# Patient Record
Sex: Male | Born: 1935 | Race: White | Hispanic: No | Marital: Married | Smoking: Never smoker
Health system: Southern US, Community
[De-identification: ages and names within clinical notes are randomized; demographics above are authoritative.]

## PROBLEM LIST (undated history)

## (undated) DIAGNOSIS — M5124 Other intervertebral disc displacement, thoracic region: Secondary | ICD-10-CM

## (undated) DIAGNOSIS — K219 Gastro-esophageal reflux disease without esophagitis: Secondary | ICD-10-CM

## (undated) DIAGNOSIS — L570 Actinic keratosis: Secondary | ICD-10-CM

## (undated) DIAGNOSIS — D649 Anemia, unspecified: Secondary | ICD-10-CM

## (undated) DIAGNOSIS — E7800 Pure hypercholesterolemia, unspecified: Secondary | ICD-10-CM

## (undated) DIAGNOSIS — E079 Disorder of thyroid, unspecified: Secondary | ICD-10-CM

## (undated) DIAGNOSIS — I2510 Atherosclerotic heart disease of native coronary artery without angina pectoris: Secondary | ICD-10-CM

## (undated) DIAGNOSIS — I10 Essential (primary) hypertension: Secondary | ICD-10-CM

## (undated) DIAGNOSIS — E039 Hypothyroidism, unspecified: Secondary | ICD-10-CM

## (undated) DIAGNOSIS — E559 Vitamin D deficiency, unspecified: Secondary | ICD-10-CM

## (undated) DIAGNOSIS — N400 Enlarged prostate without lower urinary tract symptoms: Secondary | ICD-10-CM

## (undated) DIAGNOSIS — K589 Irritable bowel syndrome without diarrhea: Secondary | ICD-10-CM

## (undated) HISTORY — PX: CARDIAC CATHETERIZATION: SHX172

## (undated) HISTORY — PX: OTHER SURGICAL HISTORY: SHX169

## (undated) HISTORY — DX: Actinic keratosis: L57.0

## (undated) HISTORY — PX: COLONOSCOPY: SHX174

---

## 1994-02-26 HISTORY — PX: HERNIA REPAIR: SHX51

## 2003-02-21 ENCOUNTER — Other Ambulatory Visit: Payer: Self-pay

## 2004-08-30 ENCOUNTER — Ambulatory Visit: Payer: Self-pay

## 2004-09-22 ENCOUNTER — Ambulatory Visit: Payer: Self-pay | Admitting: Unknown Physician Specialty

## 2004-09-22 ENCOUNTER — Other Ambulatory Visit: Payer: Self-pay

## 2004-09-25 ENCOUNTER — Ambulatory Visit: Payer: Self-pay | Admitting: Unknown Physician Specialty

## 2005-07-11 ENCOUNTER — Ambulatory Visit: Payer: Self-pay | Admitting: Unknown Physician Specialty

## 2005-08-21 ENCOUNTER — Ambulatory Visit: Payer: Self-pay | Admitting: Unknown Physician Specialty

## 2005-10-28 ENCOUNTER — Inpatient Hospital Stay: Payer: Self-pay | Admitting: Internal Medicine

## 2005-10-28 ENCOUNTER — Other Ambulatory Visit: Payer: Self-pay

## 2005-11-13 ENCOUNTER — Ambulatory Visit: Payer: Self-pay | Admitting: Family Medicine

## 2006-06-21 ENCOUNTER — Ambulatory Visit: Payer: Self-pay | Admitting: Chiropractic Medicine

## 2007-03-30 ENCOUNTER — Inpatient Hospital Stay: Payer: Self-pay | Admitting: Internal Medicine

## 2007-03-30 ENCOUNTER — Other Ambulatory Visit: Payer: Self-pay

## 2007-03-31 ENCOUNTER — Other Ambulatory Visit: Payer: Self-pay

## 2007-04-01 ENCOUNTER — Other Ambulatory Visit: Payer: Self-pay

## 2008-04-03 ENCOUNTER — Encounter: Admission: RE | Admit: 2008-04-03 | Discharge: 2008-04-03 | Payer: Self-pay | Admitting: Neurology

## 2008-06-04 ENCOUNTER — Ambulatory Visit: Payer: Self-pay | Admitting: Chiropractic Medicine

## 2008-06-11 ENCOUNTER — Ambulatory Visit: Payer: Self-pay | Admitting: Specialist

## 2008-08-11 ENCOUNTER — Ambulatory Visit (HOSPITAL_COMMUNITY): Admission: RE | Admit: 2008-08-11 | Discharge: 2008-08-11 | Payer: Self-pay | Admitting: Neurological Surgery

## 2009-01-04 ENCOUNTER — Ambulatory Visit: Payer: Self-pay | Admitting: Unknown Physician Specialty

## 2009-07-19 ENCOUNTER — Ambulatory Visit: Payer: Self-pay | Admitting: Neurological Surgery

## 2011-11-27 ENCOUNTER — Ambulatory Visit: Payer: Self-pay | Admitting: Cardiology

## 2011-12-25 ENCOUNTER — Observation Stay: Payer: Self-pay | Admitting: Internal Medicine

## 2011-12-25 LAB — COMPREHENSIVE METABOLIC PANEL
Albumin: 3.8 g/dL (ref 3.4–5.0)
Alkaline Phosphatase: 75 U/L (ref 50–136)
BUN: 12 mg/dL (ref 7–18)
Calcium, Total: 9 mg/dL (ref 8.5–10.1)
Glucose: 97 mg/dL (ref 65–99)
SGOT(AST): 29 U/L (ref 15–37)
SGPT (ALT): 28 U/L (ref 12–78)
Total Protein: 7.2 g/dL (ref 6.4–8.2)

## 2011-12-25 LAB — TROPONIN I
Troponin-I: <0.02 ng/mL
Troponin-I: <0.02 ng/mL

## 2011-12-25 LAB — APTT: Activated PTT: 31.4 secs (ref 23.6–35.9)

## 2011-12-25 LAB — CK TOTAL AND CKMB (NOT AT ARMC): CK-MB: 6.5 ng/mL — ABNORMAL HIGH (ref 0.5–3.6)

## 2011-12-26 LAB — TROPONIN I: Troponin-I: <0.02 ng/mL

## 2011-12-26 LAB — CK TOTAL AND CKMB (NOT AT ARMC)
CK, Total: 107 U/L (ref 35–232)
CK-MB: 3.8 ng/mL — ABNORMAL HIGH (ref 0.5–3.6)

## 2012-05-07 ENCOUNTER — Ambulatory Visit: Payer: Self-pay | Admitting: Ophthalmology

## 2012-07-09 ENCOUNTER — Ambulatory Visit: Payer: Self-pay | Admitting: Ophthalmology

## 2012-10-13 ENCOUNTER — Observation Stay: Payer: Self-pay | Admitting: Internal Medicine

## 2012-10-13 LAB — CBC
MCH: 32.5 pg (ref 26.0–34.0)
MCHC: 34.7 g/dL (ref 32.0–36.0)
Platelet: 166 10*3/uL (ref 150–440)
RBC: 4.3 10*6/uL — ABNORMAL LOW (ref 4.40–5.90)
RDW: 14.3 % (ref 11.5–14.5)

## 2012-10-13 LAB — BASIC METABOLIC PANEL
Anion Gap: 8 (ref 7–16)
Calcium, Total: 9.1 mg/dL (ref 8.5–10.1)
Co2: 26 mmol/L (ref 21–32)
EGFR (African American): >60
Sodium: 136 mmol/L (ref 136–145)

## 2012-10-13 LAB — CK TOTAL AND CKMB (NOT AT ARMC)
CK, Total: 261 U/L — ABNORMAL HIGH (ref 35–232)
CK, Total: 206 U/L (ref 35–232)
CK-MB: 7.3 ng/mL — ABNORMAL HIGH (ref 0.5–3.6)

## 2012-10-13 LAB — TROPONIN I: Troponin-I: <0.02 ng/mL

## 2012-10-14 LAB — CK TOTAL AND CKMB (NOT AT ARMC): CK-MB: 5.4 ng/mL — ABNORMAL HIGH (ref 0.5–3.6)

## 2013-01-07 ENCOUNTER — Ambulatory Visit: Payer: Self-pay | Admitting: Physical Medicine and Rehabilitation

## 2013-01-29 ENCOUNTER — Emergency Department: Payer: Self-pay | Admitting: Emergency Medicine

## 2013-01-29 LAB — COMPREHENSIVE METABOLIC PANEL
Albumin: 3.8 g/dL (ref 3.4–5.0)
Alkaline Phosphatase: 54 U/L
BUN: 13 mg/dL (ref 7–18)
Bilirubin,Total: 0.7 mg/dL (ref 0.2–1.0)
Calcium, Total: 9 mg/dL (ref 8.5–10.1)
Chloride: 99 mmol/L (ref 98–107)
EGFR (African American): >60
Potassium: 3.9 mmol/L (ref 3.5–5.1)
SGOT(AST): 32 U/L (ref 15–37)
SGPT (ALT): 28 U/L (ref 12–78)

## 2013-01-29 LAB — CBC
HCT: 36.8 % — ABNORMAL LOW (ref 40.0–52.0)
HGB: 12.7 g/dL — ABNORMAL LOW (ref 13.0–18.0)
Platelet: 170 10*3/uL (ref 150–440)

## 2013-01-29 LAB — PRO B NATRIURETIC PEPTIDE: B-Type Natriuretic Peptide: 434 pg/mL (ref 0–450)

## 2013-08-31 DIAGNOSIS — E039 Hypothyroidism, unspecified: Secondary | ICD-10-CM | POA: Insufficient documentation

## 2013-12-29 DIAGNOSIS — M549 Dorsalgia, unspecified: Secondary | ICD-10-CM | POA: Insufficient documentation

## 2013-12-29 DIAGNOSIS — G8929 Other chronic pain: Secondary | ICD-10-CM | POA: Insufficient documentation

## 2013-12-29 DIAGNOSIS — I714 Abdominal aortic aneurysm, without rupture, unspecified: Secondary | ICD-10-CM | POA: Insufficient documentation

## 2013-12-29 DIAGNOSIS — Z9889 Other specified postprocedural states: Secondary | ICD-10-CM | POA: Insufficient documentation

## 2014-06-15 NOTE — H&P (Signed)
PATIENT NAME:  Brad Brady, Brad Brady MR#:  [id number] DATE OF BIRTH:  04/11/1927  DATE OF ADMISSION:  12/25/2011  PRIMARY CARE PHYSICIAN:  Dr. Ivy Lynn.  CARDIOLOGIST: Dr. Saralyn Pilar.   CHIEF COMPLAINT: Chest discomfort since this morning along with acid reflux.   HISTORY OF PRESENT ILLNESS: Brad Brady is a very pleasant 81 year old Caucasian gentleman with past medical history of coronary artery disease status post PCI of PDA in June 1994, who comes to the Emergency Room after he started having some chest heaviness this morning. He had an episode of diaphoresis one time. He denies any nausea or vomiting, but does complain of acid reflux and indigestion. The patient reports he had family over and had "party time" and ate different kinds of food in good quantities yesterday. He felt very full this morning and felt he was having some acid reflux. He started noticing chest discomfort along with diaphoresis and, hence, decided to come to the Emergency Room. He took two 81 mg aspirins prior to coming to the Emergency Room. He is currently chest pain free. He is being admitted for further evaluation and management. His EKG shows sinus rhythm with right bundle branch block. There is no acute ST elevation or depression. His first set of cardiac enzymes are negative.   PAST MEDICAL HISTORY:  1. History of coronary artery disease, status post PCI in 1994.  2. Hyperlipidemia.  3. Hypertension.  4. History of abdominal aortic aneurysm followed by Dr. Lucky Cowboy.  5. Hypothyroidism.   FAMILY HISTORY: Positive for hypertension.   SOCIAL HISTORY: Denies alcohol or tobacco abuse. The patient is married, lives with his wife.   MEDICATIONS:  1. Aspirin 81 mg daily at bedtime.  2. Atenolol 50 mg daily.  3. Jalyn 0.5/0.4 one tablet p.o. daily. 4. Lipitor 20 milligrams p.o. at bedtime.  5. Nexium 40 mg p.o. daily as needed.  6. Synthroid 100 mcg p.o. daily.   REVIEW OF SYSTEMS: CONSTITUTIONAL: No fever, fatigue, or  weakness. EYES: No blurred or double vision or glaucoma. ENT: No tinnitus, ear pain, or hearing loss. RESPIRATORY: No cough, wheeze, hemoptysis, dyspnea. CARDIOVASCULAR: Chest discomfort. No dyspnea on exertion or palpitations. GASTROINTESTINAL: Positive for acid reflux. No nausea, vomiting, diarrhea, abdominal pain. GU: No dysuria or hematuria. ENDOCRINE: No polyuria or nocturia. HEMATOLOGY: No anemia or easy bruising. SKIN: No acne or rash. MUSCULOSKELETAL: Positive for arthritis. NEUROLOGIC: No cerebrovascular accident, transient ischemic attack. PSYCH: No anxiety or depression. All other systems reviewed and negative.   PHYSICAL EXAMINATION:  GENERAL: The patient is awake, alert, oriented x3, not in acute distress.   VITAL SIGNS: Afebrile, pulse 63 to 64 and regular, blood pressure 132/68, sats 100% on room air.   HEENT: Atraumatic, normocephalic. Pupils are equal, round, and reactive to light and accommodation. Extraocular movements intact. Oral mucosa is moist.   NECK: Supple. No JVD. No carotid bruit.   LUNGS: Clear to auscultation bilaterally. No rales, rhonchi, respiratory distress, or labored breathing.   CARDIOVASCULAR: Both the heart sounds are normal. Rate, rhythm is regular. PMI not lateralized. Chest is nontender.   EXTREMITIES: Good pedal pulse. Good femoral pulses. No lower extremity edema.   NEUROLOGIC: Gross intact cranial nerves II through XII. No motor or sensory deficits.   PSYCH: The patient is awake, alert, oriented x3.   LABORATORY, RADIOLOGICAL AND DIAGNOSTIC DATA: EKG shows normal sinus rhythm with right bundle branch block. No acute ST elevation or depression. Cardiac enzymes, first set, negative. PT-INR within normal limits. CBC within normal  limits. Comprehensive metabolic panel within normal limits.   ASSESSMENT: 82 year old Brad Brady with history of coronary artery disease status post PCI in 1994 with history of hypertension and gastroesophageal reflux disease  comes in with:  1. Chest heaviness with one episode of diaphoresis this morning. The patient has had coronary artery disease with PCI in 1994. Cardiac enzymes, first set, negative. We will admit the patient to observation for Dr. Ivy Lynn. Will cycle cardiac enzymes x3. Continue aspirin, atenolol and nitroglycerin as needed.  2. Will schedule the patient for Lexiscan, Myoview stress test. The patient was supposed to get stress test about two weeks ago, however, he was not able to reschedule it because of his back pain. We will consult cardiology if the patient rules in. At present, the patient is chest pain free.  3. Hypertension. Continue atenolol.  4. Gastroesophageal reflux disease/acid reflux, on Nexium.  5. Hypothyroidism. Continue Synthroid.   Further work-up according to the patient's clinical course. Hospital admission plan was discussed with the patient. No family members present in the Emergency Room.   TIME SPENT: 50 minutes.  ____________________________ Hart Rochester Posey Pronto, MD sap:ap D: 12/25/2011 13:10:26 ET T: 12/25/2011 14:02:31 ET JOB#: [id number]  cc: Ermal Haberer A. Posey Pronto, MD, <Dictator> John B. Sarina Ser, MD Isaias Cowman, MD Ilda Basset MD ELECTRONICALLY SIGNED 12/29/2011 12:51

## 2014-06-18 NOTE — Op Note (Signed)
PATIENT NAME:  Brad Brady, Brad Brady MR#:  [id number] DATE OF BIRTH:  [birth year]  DATE OF PROCEDURE:  05/07/2012  PREOPERATIVE DIAGNOSIS:  Senile cataract with miotic pupil, right eye.  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  PROCEDURE:  Phacoemulsification with posterior chamber intraocular lens implantation of the right eye.  LENS:  ZCB00 24.0-diopter posterior chamber intraocular lens.  ULTRASOUND TIME:  22% of 1 minute, 20 seconds. CDE 17.7.   SURGEON:  Mali Brasington, MD  ANESTHESIA:  Topical with tetracaine drops and 2% Xylocaine jelly.  COMPLICATIONS:  None.  DESCRIPTION OF PROCEDURE:  The patient was identified in the holding room and transported to the operating room and placed in the supine position under the operating microscope.  The right eye was identified as the operative eye and it was prepped and draped in the usual sterile ophthalmic fashion.  A 1 millimeter clear-corneal paracentesis was made at the 1:30 position.  The anterior chamber was filled with Viscoat viscoelastic.  A 2.4 millimeter keratome was used to make a near-clear corneal incision at the 10:30 position.  A curvilinear capsulorrhexis was made with a cystotome and capsulorrhexis forceps.  Balanced salt solution was used to hydrodissect and hydrodelineate the nucleus.  Phacoemulsification was then used in stop and chop fashion to remove the lens nucleus and epinucleus.  The remaining cortex was then removed using the irrigation and aspiration handpiece. Provisc was then placed into the capsular bag to distend it for lens placement.  A ZCB00 24.0-diopter lens was then injected into the capsular bag.  The remaining viscoelastic was aspirated.  Wounds were hydrated with balanced salt solution.  The anterior chamber was inflated to a physiologic pressure with balanced salt solution.  0.1 mL of cefuroxime 10 mg/mL were injected into the anterior chamber for a dose of  1 mg of intracameral antibiotic at the completion of the case. Miostat was placed into the anterior chamber to constrict the pupil.  No wound leaks were noted.  Topical Vigamox drops and Maxitrol ointment were applied to the eye.  The patient was taken to the recovery room in stable condition without complications of anesthesia or surgery.   ____________________________ Wyonia Hough, MD crb:jm D: 05/07/2012 15:29:42 ET T: 05/07/2012 16:19:41 ET JOB#: [id number]  cc: Wyonia Hough, MD, <Dictator> Leandrew Koyanagi MD ELECTRONICALLY SIGNED 05/14/2012 12:46

## 2014-06-18 NOTE — Op Note (Signed)
PATIENT NAME:  Brad Brady, Brad Brady MR#:  [id number] DATE OF BIRTH:  Jan 24, 1928  DATE OF PROCEDURE:  07/09/2012  PREOPERATIVE DIAGNOSIS:  Senile cataract left eye with miotic pupil.  POSTOPERATIVE DIAGNOSIS:  Senile cataract left eye with miotic pupil.  PROCEDURE:  Phacoemulsification with posterior chamber intraocular lens placement which required pupil stretching with the Graether pupil expansion device.   LENS IMPLANT:  ZCBOO, 24.0-diopter.   ULTRASOUND TIME:   18% of 1 minute and 37 seconds for CDE of 17.6.    SURGEON:  Mali Latasha Puskas, MD  ANESTHESIA:  Retrobulbar block of Xylocaine and Bupivacaine and Vitrase.   COMPLICATIONS:  None.  DESCRIPTION OF PROCEDURE:  The patient was identified in the holding room, transported to the operating room, and placed in the supine position.  The left eye was identified as the operative eye and a retrobulbar block was administered under intravenous sedation.  It was then prepped and draped in the usual sterile ophthalmic fashion.  The eye was inspected and the pupil was noted to be between 4 mm with maximal pharmacologic dilation.  A 1 mm side-port incision was made at the 1:30 position.  The anterior chamber was filled with Viscoat.  A 2.75 millimeter near-clear corneal incision was made at the 10:30 position.  A Graether pupil expander was then placed through the main incision and into the anterior chamber of the eye.  The edge of the iris was secured on the lip of the pupil expander and it was released, thereby expanding the pupil to approximately 8 mm for completion of the cataract surgery.  A cystotome and capsulorrhexis forceps were used to make a curvilinear capsulorrhexis.   Balanced salt solution was used to hydrodissect and hydrodelineate the lens nucleus.  Phacoemulsification was used in stop and chop fashion to remove the lens, nucleus and epinucleus.  The remaining cortex was aspirated using the irrigation aspiration handpiece.  Additional  Provisc was placed into the eye to distend the capsular bag for lens placement.  A ZCBOO 24.0-diopter lens was then injected into the capsular bag.  The remaining viscoelastic was aspirated from the capsular bag and the anterior chamber. The pupil expanding ring was removed using a Kuglen hook.  The anterior chamber was filled with balanced salt solution to inflate to a physiologic pressure.    The wounds were hydrated with balanced salt solution.  Miostat was placed into the anterior chamber. 0.1 mL of cefuroxime 10 mg/mL were injected into the anterior chamber for a dose of 1 mg of intracameral antibiotic at the completion of the case.  The eye was noted to have a physiologic pressure without wound leak. Topical Vigamox drops and Maxitrol ointment were applied to the eye.  The eye was shielded.   The patient was taken to the recovery room in stable condition.  ____________________________ Wyonia Hough, MD crb:cs D: 07/09/2012 14:48:15 ET T: 07/09/2012 15:07:01 ET JOB#: [id number]  cc: Wyonia Hough, MD, <Dictator> Leandrew Koyanagi MD ELECTRONICALLY SIGNED 07/16/2012 12:13

## 2014-06-18 NOTE — H&P (Signed)
PATIENT NAME:  Brad Brady, Brad Brady MR#:  [id number] DATE OF BIRTH:  01/31/1928  DATE OF ADMISSION:  10/13/2012  PRIMARY CARE PROVIDER: Dr. Gilford Rile.   EMERGENCY DEPARTMENT REFERRING PHYSICIAN: Dr. Corky Downs.   CHIEF COMPLAINT: Near syncope.   HISTORY OF PRESENT ILLNESS: The patient is a pleasant 81 year old white male with history of coronary artery disease, status post PCI of the PDA June 1994. Also has hyperlipidemia, hypertension, has a history of abdominal aortic aneurysm and hypothyroidism. The patient reports that he has had a problem with intermittent abdominal distention and bloating, nausea, constipation, who states that he went to eat breakfast at [employer] earlier today and after eating breakfast he was sitting at the table when he all of a sudden became nauseous and then subsequently had an episode where he sort of felt like he was going to pass out and  briefly, he states that he might have passed out while sitting at the table. The patient otherwise subsequently afterwards was normal, did not have any seizure-type activity. The patient reports having no similar type of symptoms in the past. He did not have any chest pain or shortness of breath. No abdominal pain. He was nauseous, but no vomiting or diarrhea.    PAST MEDICAL HISTORY: 1.  History of coronary artery disease, status post angioplasty in 1994.  2.  Hyperlipidemia.  3.  Hypertension.  4.  History of abdominal aortic aneurysm followed by Dr. Lucky Cowboy. He has been told that his aneurysm is doing well and does not have a scheduled follow-up.  5.  Hypothyroidism.   ALLERGIES: None.   CURRENT MEDICATIONS: He is on aspirin 81 mg 1 tab p.o. daily, atenolol 50 daily, Jalyn  0.5/0.41 tab p.o. daily, Lipitor 20 daily, Nembutal 500 daily, Nexium 40 mg 1 tab p.o. daily as needed, Synthroid 100 mcg daily.   FAMILY HISTORY: Positive for hypertension.   SOCIAL HISTORY: Denies any alcohol or tobacco abuse. The patient is married lives with his wife.    REVIEW OF SYSTEMS:  CONSTITUTIONAL: Denies any fevers, fatigue, no weakness. No pain. No weight loss. No weight gain.  EYES: No blurred or double vision. No pain. No redness. No inflammation. History of bilateral cataracts, which have been resected.  ENT: No tinnitus. No ear pain. No hearing loss. No seasonal or year-round allergies. No difficulty with swallowing. No postnasal drip. No sinus pain.  RESPIRATORY: Denies any cough, wheezing, hemoptysis. No dyspnea.  CARDIOVASCULAR: Denies any chest pain, orthopnea, edema or arrhythmia.  GASTROINTESTINAL: Complains of intermittent nausea, abdominal distention, constipation. No rectal bleeding.  GENITOURINARY: Denies any dysuria, hematuria, renal calculus or frequency.  ENDOCRINE: Denies any polyuria, nocturia, does have hypothyroidism.  HEMATOLOGIC/LYMPHATIC:  No anemia, easy bruising or bleeding.  SKIN: No acne. No rash. No changes in mole, hair or skin.  MUSCULOSKELETAL: Denies any pain in the neck, back or shoulder.  NEUROLOGIC: No numbness. No CVA. No transient ischemic attack. No seizures.  PSYCHIATRIC: No anxiety. No insomnia. No ADD.   PHYSICAL EXAMINATION: VITAL SIGNS: Temperature 97.8, pulse 56, respirations 18, blood pressure 145/78, O2 100% on room air.  GENERAL: The patient is a well-developed, well-nourished male in no acute distress.  HEENT: Head normocephalic, atraumatic. Pupils equally round, reactive to light and accommodation. There is no conjunctival pallor. No scleral icterus.  NECK: Supple. No JVD. No carotid bruits.  CARDIOVASCULAR: Regular rate and rhythm. No murmurs, rubs, clicks or gallops. PMI is not displaced.  LUNGS: Clear to auscultation bilaterally without any rales, rhonchi or wheezing.  CARDIOVASCULAR: Regular rate and rhythm. No murmurs, rubs, clicks or gallops. PMI is not displaced.  ABDOMEN: Soft, nontender, nondistended. Positive bowel sounds x 4.  EXTREMITIES: No clubbing, cyanosis or edema.   SKIN: No  rash.  LYMPHATICS: No lymph nodes palpable.  VASCULAR: Good DP, PT pulses.  PSYCHIATRIC: Not anxious or depressed.  NEUROLOGIC: Awake, alert, oriented x 3. No focal deficits.  LYMPHATICS: No lymph nodes palpable.   LABORATORY, DIAGNOSTIC OR RADIOLOGIC DATA:  EKG normal sinus rhythm without any ST-T wave changes.   Glucose 93, BUN 12, creatinine 0.91, sodium 136, potassium 3.9, chloride 102, CO2 is 26, calcium is 9, Troponin less than 0.02. WBC 7.7, hemoglobin 14, platelet count 166.   ASSESSMENT AND PLAN: The patient is an 81 year old who presents with near-syncope.  1.  Near syncope with possible vasovagal in light of the gastrointestinal symptoms. However, with his age and his history of coronary artery disease, need to rule out cardiac cause for his near syncope, including arrhythmias. We will place him on telemetry, get an echocardiogram, place under observation.  2.  Hypothyroidism. We will continue Synthroid as taking at home.  3.  Hypertension. Continue atenolol, we will check orthostatics.  4.  Hyperlipidemia. Continue Lipitor as taking at home.    TIME SPENT: Note 45 minutes spent with the patient.     ____________________________ Lafonda Mosses. Posey Pronto, MD shp:cc D: 10/13/2012 16:07:50 ET T: 10/13/2012 17:05:29 ET JOB#: [id number]  cc: Nazaria Riesen H. Posey Pronto, MD, <Dictator> Alric Seton MD ELECTRONICALLY SIGNED 10/20/2012 13:10

## 2014-09-05 ENCOUNTER — Encounter: Payer: Self-pay | Admitting: Medical Oncology

## 2014-09-05 ENCOUNTER — Emergency Department: Payer: Medicare PPO

## 2014-09-05 ENCOUNTER — Observation Stay
Admit: 2014-09-05 | Discharge: 2014-09-05 | Disposition: A | Discharge status: Home / Self Care | Payer: Medicare PPO | Attending: Internal Medicine | Admitting: Internal Medicine

## 2014-09-05 ENCOUNTER — Observation Stay
Admission: EM | Admit: 2014-09-05 | Discharge: 2014-09-07 | Disposition: A | Discharge status: Home / Self Care | Payer: Medicare PPO | Attending: Internal Medicine | Admitting: Internal Medicine

## 2014-09-05 DIAGNOSIS — R0981 Nasal congestion: Secondary | ICD-10-CM | POA: Diagnosis not present

## 2014-09-05 DIAGNOSIS — K5900 Constipation, unspecified: Secondary | ICD-10-CM | POA: Diagnosis not present

## 2014-09-05 DIAGNOSIS — I10 Essential (primary) hypertension: Secondary | ICD-10-CM | POA: Insufficient documentation

## 2014-09-05 DIAGNOSIS — I200 Unstable angina: Secondary | ICD-10-CM

## 2014-09-05 DIAGNOSIS — R9431 Abnormal electrocardiogram [ECG] [EKG]: Secondary | ICD-10-CM | POA: Diagnosis not present

## 2014-09-05 DIAGNOSIS — R079 Chest pain, unspecified: Secondary | ICD-10-CM | POA: Diagnosis present

## 2014-09-05 DIAGNOSIS — I714 Abdominal aortic aneurysm, without rupture: Secondary | ICD-10-CM | POA: Diagnosis not present

## 2014-09-05 DIAGNOSIS — I2510 Atherosclerotic heart disease of native coronary artery without angina pectoris: Secondary | ICD-10-CM | POA: Insufficient documentation

## 2014-09-05 DIAGNOSIS — N400 Enlarged prostate without lower urinary tract symptoms: Secondary | ICD-10-CM | POA: Diagnosis not present

## 2014-09-05 DIAGNOSIS — I341 Nonrheumatic mitral (valve) prolapse: Secondary | ICD-10-CM | POA: Diagnosis not present

## 2014-09-05 DIAGNOSIS — I472 Ventricular tachycardia: Secondary | ICD-10-CM | POA: Diagnosis not present

## 2014-09-05 DIAGNOSIS — E780 Pure hypercholesterolemia: Secondary | ICD-10-CM | POA: Diagnosis not present

## 2014-09-05 DIAGNOSIS — E785 Hyperlipidemia, unspecified: Secondary | ICD-10-CM | POA: Insufficient documentation

## 2014-09-05 DIAGNOSIS — I739 Peripheral vascular disease, unspecified: Secondary | ICD-10-CM | POA: Insufficient documentation

## 2014-09-05 DIAGNOSIS — R0789 Other chest pain: Secondary | ICD-10-CM

## 2014-09-05 DIAGNOSIS — E079 Disorder of thyroid, unspecified: Secondary | ICD-10-CM | POA: Diagnosis not present

## 2014-09-05 DIAGNOSIS — R001 Bradycardia, unspecified: Secondary | ICD-10-CM | POA: Diagnosis not present

## 2014-09-05 DIAGNOSIS — Z7982 Long term (current) use of aspirin: Secondary | ICD-10-CM | POA: Diagnosis not present

## 2014-09-05 DIAGNOSIS — I071 Rheumatic tricuspid insufficiency: Secondary | ICD-10-CM | POA: Insufficient documentation

## 2014-09-05 DIAGNOSIS — I4510 Unspecified right bundle-branch block: Secondary | ICD-10-CM | POA: Diagnosis not present

## 2014-09-05 DIAGNOSIS — K219 Gastro-esophageal reflux disease without esophagitis: Secondary | ICD-10-CM | POA: Insufficient documentation

## 2014-09-05 DIAGNOSIS — I25110 Atherosclerotic heart disease of native coronary artery with unstable angina pectoris: Secondary | ICD-10-CM | POA: Diagnosis not present

## 2014-09-05 DIAGNOSIS — I340 Nonrheumatic mitral (valve) insufficiency: Secondary | ICD-10-CM | POA: Diagnosis not present

## 2014-09-05 HISTORY — DX: Atherosclerotic heart disease of native coronary artery without angina pectoris: I25.10

## 2014-09-05 HISTORY — DX: Disorder of thyroid, unspecified: E07.9

## 2014-09-05 HISTORY — DX: Pure hypercholesterolemia, unspecified: E78.00

## 2014-09-05 HISTORY — DX: Essential (primary) hypertension: I10

## 2014-09-05 HISTORY — DX: Gastro-esophageal reflux disease without esophagitis: K21.9

## 2014-09-05 HISTORY — DX: Benign prostatic hyperplasia without lower urinary tract symptoms: N40.0

## 2014-09-05 LAB — BASIC METABOLIC PANEL
ANION GAP: 9 (ref 5–15)
BUN: 15 mg/dL (ref 6–20)
CHLORIDE: 95 mmol/L — AB (ref 101–111)
CO2: 24 mmol/L (ref 22–32)
CREATININE: 0.98 mg/dL (ref 0.61–1.24)
Calcium: 8.5 mg/dL — ABNORMAL LOW (ref 8.9–10.3)
GFR calc non Af Amer: >60 mL/min (ref >60)
Glucose, Bld: 100 mg/dL — ABNORMAL HIGH (ref 65–99)
POTASSIUM: 4 mmol/L (ref 3.5–5.1)
Sodium: 128 mmol/L — ABNORMAL LOW (ref 135–145)

## 2014-09-05 LAB — CBC
HCT: 37.8 % — ABNORMAL LOW (ref 40.0–52.0)
HEMOGLOBIN: 12.7 g/dL — AB (ref 13.0–18.0)
MCH: 32.8 pg (ref 26.0–34.0)
MCHC: 33.6 g/dL (ref 32.0–36.0)
MCV: 97.4 fL (ref 80.0–100.0)
Platelets: 166 10*3/uL (ref 150–440)
RBC: 3.88 MIL/uL — AB (ref 4.40–5.90)
RDW: 14.3 % (ref 11.5–14.5)
WBC: 6.8 10*3/uL (ref 3.8–10.6)

## 2014-09-05 LAB — TROPONIN I
Troponin I: <0.03 ng/mL (ref <0.031)
Troponin I: <0.03 ng/mL (ref <0.031)

## 2014-09-05 MED ORDER — MORPHINE SULFATE 2 MG/ML IJ SOLN: 2.0000 mg | INTRAMUSCULAR | Status: DC | PRN

## 2014-09-05 MED ORDER — DUTASTERIDE 0.5 MG PO CAPS
0.5000 mg | ORAL_CAPSULE | Freq: Every day | ORAL | Status: DC
  Administered 2014-09-05 – 2014-09-06 (×2): 0.5 mg via ORAL
  Filled 2014-09-05 (×2): qty 1

## 2014-09-05 MED ORDER — ONDANSETRON HCL 4 MG PO TABS: 4.0000 mg | ORAL_TABLET | Freq: Four times a day (QID) | ORAL | Status: DC | PRN

## 2014-09-05 MED ORDER — ATORVASTATIN CALCIUM 20 MG PO TABS
20.0000 mg | ORAL_TABLET | Freq: Every day | ORAL | Status: DC
  Administered 2014-09-05: 20 mg via ORAL
  Filled 2014-09-05: qty 1

## 2014-09-05 MED ORDER — NITROGLYCERIN 0.4 MG SL SUBL
SUBLINGUAL_TABLET | SUBLINGUAL | Status: AC
Start: 2014-09-05 — End: 2014-09-05
  Administered 2014-09-05: 0.4 mg via SUBLINGUAL
  Filled 2014-09-05: qty 3

## 2014-09-05 MED ORDER — LEVOTHYROXINE SODIUM 100 MCG PO TABS
100.0000 ug | ORAL_TABLET | Freq: Every day | ORAL | Status: DC
  Administered 2014-09-05 – 2014-09-06 (×2): 100 ug via ORAL
  Filled 2014-09-05 (×2): qty 1

## 2014-09-05 MED ORDER — OCUVITE-LUTEIN PO CAPS
1.0000 | ORAL_CAPSULE | Freq: Every day | ORAL | Status: DC
  Administered 2014-09-05: 1 via ORAL
  Filled 2014-09-05 (×2): qty 1

## 2014-09-05 MED ORDER — PANTOPRAZOLE SODIUM 40 MG IV SOLR
40.0000 mg | Freq: Two times a day (BID) | INTRAVENOUS | Status: DC
  Administered 2014-09-05 – 2014-09-06 (×3): 40 mg via INTRAVENOUS
  Filled 2014-09-05 (×3): qty 40

## 2014-09-05 MED ORDER — VITAMIN D 1000 UNITS PO TABS
2000.0000 [IU] | ORAL_TABLET | Freq: Every morning | ORAL | Status: DC
  Administered 2014-09-05 – 2014-09-07 (×3): 2000 [IU] via ORAL
  Filled 2014-09-05 (×3): qty 2

## 2014-09-05 MED ORDER — NITROGLYCERIN 0.4 MG SL SUBL: 0.4000 mg | SUBLINGUAL_TABLET | SUBLINGUAL | Status: DC | PRN

## 2014-09-05 MED ORDER — ENOXAPARIN SODIUM 80 MG/0.8ML ~~LOC~~ SOLN
1.0000 mg/kg | Freq: Two times a day (BID) | SUBCUTANEOUS | Status: DC
  Administered 2014-09-05 – 2014-09-06 (×3): 80 mg via SUBCUTANEOUS
  Filled 2014-09-05 (×5): qty 0.8

## 2014-09-05 MED ORDER — ACETAMINOPHEN 650 MG RE SUPP: 650.0000 mg | Freq: Four times a day (QID) | RECTAL | Status: DC | PRN

## 2014-09-05 MED ORDER — DOCUSATE SODIUM 100 MG PO CAPS
100.0000 mg | ORAL_CAPSULE | Freq: Two times a day (BID) | ORAL | Status: DC
  Administered 2014-09-05 – 2014-09-06 (×3): 100 mg via ORAL
  Filled 2014-09-05 (×3): qty 1

## 2014-09-05 MED ORDER — SODIUM CHLORIDE 0.9 % IV SOLN
Freq: Once | INTRAVENOUS | Status: AC
  Administered 2014-09-05: 11:00:00 via INTRAVENOUS

## 2014-09-05 MED ORDER — ACETAMINOPHEN 325 MG PO TABS
650.0000 mg | ORAL_TABLET | Freq: Four times a day (QID) | ORAL | Status: DC | PRN
  Administered 2014-09-05 – 2014-09-06 (×2): 650 mg via ORAL
  Filled 2014-09-05 (×2): qty 2

## 2014-09-05 MED ORDER — SODIUM CHLORIDE 0.9 % IV SOLN
INTRAVENOUS | Status: DC
  Administered 2014-09-05 – 2014-09-06 (×4): via INTRAVENOUS

## 2014-09-05 MED ORDER — TAMSULOSIN HCL 0.4 MG PO CAPS
0.4000 mg | ORAL_CAPSULE | Freq: Every day | ORAL | Status: DC
  Administered 2014-09-05 – 2014-09-06 (×2): 0.4 mg via ORAL
  Filled 2014-09-05 (×2): qty 1

## 2014-09-05 MED ORDER — ATENOLOL 25 MG PO TABS
50.0000 mg | ORAL_TABLET | Freq: Every day | ORAL | Status: DC
  Administered 2014-09-05: 50 mg via ORAL
  Filled 2014-09-05: qty 2

## 2014-09-05 MED ORDER — ASPIRIN EC 81 MG PO TBEC
81.0000 mg | DELAYED_RELEASE_TABLET | Freq: Every day | ORAL | Status: DC
Start: 2014-09-05 — End: 2014-09-07
  Administered 2014-09-06 – 2014-09-07 (×2): 81 mg via ORAL
  Filled 2014-09-05 (×2): qty 1

## 2014-09-05 MED ORDER — ONDANSETRON HCL 4 MG/2ML IJ SOLN
4.0000 mg | Freq: Four times a day (QID) | INTRAMUSCULAR | Status: DC | PRN
  Administered 2014-09-06: 4 mg via INTRAVENOUS
  Filled 2014-09-05: qty 2

## 2014-09-05 MED ORDER — NITROGLYCERIN 0.4 MG SL SUBL
0.4000 mg | SUBLINGUAL_TABLET | Freq: Once | SUBLINGUAL | Status: AC
  Administered 2014-09-05: 0.4 mg via SUBLINGUAL

## 2014-09-05 MED ORDER — ALUM & MAG HYDROXIDE-SIMETH 200-200-20 MG/5ML PO SUSP: 30.0000 mL | ORAL | Status: DC | PRN

## 2014-09-05 MED ORDER — SODIUM CHLORIDE 0.9 % IJ SOLN
3.0000 mL | Freq: Two times a day (BID) | INTRAMUSCULAR | Status: DC
  Administered 2014-09-05 – 2014-09-07 (×3): 3 mL via INTRAVENOUS

## 2014-09-05 MED ORDER — FAMOTIDINE 20 MG PO TABS
40.0000 mg | ORAL_TABLET | Freq: Every day | ORAL | Status: DC
  Administered 2014-09-05: 40 mg via ORAL
  Filled 2014-09-05: qty 2

## 2014-09-05 NOTE — ED Provider Notes (Signed)
[facility] Emergency Department Provider Note  ____________________________________________  Time seen: 10:15 AM  I have reviewed the triage vital signs and the nursing notes.   HISTORY  Chief Complaint Chest Pain    HPI Brad Brady is a 81 y.o. male who complains of chest pain for approximately 24 hours. He reports the pain is moderate pressure-like and in his central chest. He reports a history of coronary artery disease and sees Dr. Saralyn Pilar. He reports a history of 2 angioplasties but denies a history of heart attacks. He denies cough or shortness of breath. No recent travel. He notes that he was working out in the heat yesterday prior to this pain starting. He takes an aspirin every other day but has not taken anything for this pain.     Past Medical History  Diagnosis Date  . Hypertension   . Thyroid disease   . High cholesterol   . BPH (benign prostatic hyperplasia)     There are no active problems to display for this patient.   Past Surgical History  Procedure Laterality Date  . Hernia repair      No current outpatient prescriptions on file.  Allergies Review of patient's allergies indicates no known allergies.  No family history on file.  Social History History  Substance Use Topics  . Smoking status: Never Smoker   . Smokeless tobacco: Not on file  . Alcohol Use: No    Review of Systems  Constitutional: Negative for fever. Eyes: Negative for visual changes. ENT: Negative for sore throat Cardiovascular: Positive for chest pain Respiratory: Negative for shortness of breath. Gastrointestinal: Negative for abdominal pain, vomiting and diarrhea. Genitourinary: Negative for dysuria. Musculoskeletal: Negative for back pain. Skin: Negative for rash. Neurological: Negative for headaches or focal weakness Psychiatric: No anxiety  10-point ROS otherwise negative.  ____________________________________________   PHYSICAL  EXAM:  VITAL SIGNS: ED Triage Vitals  Enc Vitals Group     BP 09/05/14 0949 147/75 mmHg     Pulse Rate 09/05/14 0949 60     Resp 09/05/14 0949 18     Temp 09/05/14 0949 97.7 F (36.5 C)     Temp Source 09/05/14 0949 Oral     SpO2 09/05/14 0949 100 %     Weight 09/05/14 0944 173 lb (78.472 kg)     Height 09/05/14 0944 5\' 10"  (1.778 m)     Head Cir --      Peak Flow --      Pain Score 09/05/14 0945 5     Pain Loc --      Pain Edu? --      Excl. in [place]? --      Constitutional: Alert and oriented. Well appearing and in no distress. Eyes: Conjunctivae are normal.  ENT   Head: Normocephalic and atraumatic.   Mouth/Throat: Mucous membranes are moist. Cardiovascular: Normal rate, regular rhythm. Normal and symmetric distal pulses are present in all extremities. No murmurs, rubs, or gallops. Respiratory: Normal respiratory effort without tachypnea nor retractions. Breath sounds are clear and equal bilaterally.  Gastrointestinal: Soft and non-tender in all quadrants. No distention. There is no CVA tenderness. Genitourinary: deferred Musculoskeletal: Nontender with normal range of motion in all extremities. No lower extremity tenderness nor edema. Neurologic:  Normal speech and language. No gross focal neurologic deficits are appreciated. Skin:  Skin is warm, dry and intact. No rash noted. Psychiatric: Mood and affect are normal. Patient exhibits appropriate insight and judgment.  ____________________________________________  LABS (pertinent positives/negatives)  Labs Reviewed  CBC - Abnormal; Notable for the following:    RBC 3.88 (*)    Hemoglobin 12.7 (*)    HCT 37.8 (*)    All other components within normal limits  BASIC METABOLIC PANEL  TROPONIN I    ____________________________________________   EKG  ED ECG REPORT I, Lavonia Drafts, the attending physician, personally viewed and interpreted this ECG.   Date: 09/05/2014  EKG Time: 9:47 AM  Rate: 58   Rhythm: sinus bradycardia  Axis: Normal axis  Intervals:right bundle branch block  ST&T Change: Nonspecific   ____________________________________________    RADIOLOGY I have personally reviewed any xrays that were ordered on this patient: Chest x-ray unremarkable  ____________________________________________   PROCEDURES  Procedure(s) performed: none  Critical Care performed: none  ____________________________________________   INITIAL IMPRESSION / ASSESSMENT AND PLAN / ED COURSE  Pertinent labs & imaging results that were available during my care of the patient were reviewed by me and considered in my medical decision making (see chart for details).  Patient with a history of coronary artery disease and central pressure-like chest pain after exertion yesterday. We will certainly check cardiac enzymes and labs. We will give nitroglycerin sublingual. Based on testing results we will give strong consideration to admission   Patient given 1 sublingual nitrogen after which she began feeling dizzy and lightheaded and his pressure decreased to 60/40. We started IV fluids and he rapidly improved back to a blood pressure of 111/70.  Discussed with Dr. Doy Hutching for admission ____________________________________________   FINAL CLINICAL IMPRESSION(S) / ED DIAGNOSES  Final diagnoses:  Chest pain, unspecified chest pain type     Lavonia Drafts, MD 09/05/14 1128

## 2014-09-05 NOTE — Progress Notes (Signed)
Skin verified by doll ferguson rn 

## 2014-09-05 NOTE — Progress Notes (Signed)
*  PRELIMINARY RESULTS* Echocardiogram 2D Echocardiogram has been performed.  Brad Brady 09/05/2014, 3:50 PM

## 2014-09-05 NOTE — ED Notes (Signed)
Pt ambulatory to triage from [facility]- sent d/t EKG changes. Pt reports that he began having central chest pain yesterday that has continued. Denies other sx's.

## 2014-09-05 NOTE — ED Notes (Signed)
Patient states that weakness and dizziness has resolved. Patient remains alert and oriented at this time.

## 2014-09-05 NOTE — H&P (Signed)
History and Physical    Brad Brady:[id number] DOB: 1927-12-21 DOA: 09/05/2014  Referring physician: Dr. Corky Downs PCP: Madelyn Brunner, MD  Specialists: Dr. Saralyn Pilar  Chief Complaint: Chest pain  HPI: Brad Brady is a 81 y.o. male has a past medical history significant for ASCVD, HTN, GERD who presents with 24h hx of CP described as pressure brought on by exertion. Having persistent pain despite rest and SL NTG given in ER. Pain is non-radiating and not associated with N/V, diaphoresis or SOB. EKG shows new RBBB. He is now admitted for further evaluation.  Review of Systems: The patient denies anorexia, fever, weight loss,, vision loss, decreased hearing, hoarseness, syncope, dyspnea on exertion, peripheral edema, balance deficits, hemoptysis, abdominal pain, melena, hematochezia, severe indigestion/heartburn, hematuria, incontinence, genital sores, muscle weakness, suspicious skin lesions, transient blindness, difficulty walking, depression, unusual weight change, abnormal bleeding, enlarged lymph nodes, angioedema, and breast masses.   Past Medical History  Diagnosis Date  . Hypertension   . Thyroid disease   . High cholesterol   . BPH (benign prostatic hyperplasia)   . GERD (gastroesophageal reflux disease)   . Coronary artery disease    Past Surgical History  Procedure Laterality Date  . Hernia repair     Social History:  reports that he has never smoked. He does not have any smokeless tobacco history on file. He reports that he does not drink alcohol or use illicit drugs.  No Known Allergies  History reviewed. No pertinent family history.  Prior to Admission medications   Not on File   Physical Exam: Filed Vitals:   09/05/14 1051 09/05/14 1055 09/05/14 1100 09/05/14 1115  BP: 82/61 92/59 113/66 123/67  Pulse: 58 55 56 58  Temp:      TempSrc:      Resp: 12 16 10 13   Height:      Weight:      SpO2: 98% 96% 98% 98%     General:  No apparent  distress  Eyes: PERRL, EOMI, no scleral icterus  ENT: moist oropharynx  Neck: supple, no lymphadenopathy  Cardiovascular: regular rate without MRG; 2+ peripheral pulses, no JVD, no peripheral edema  Respiratory: CTA biL, good air movement without wheezing, rhonchi or crackled  Abdomen: soft, non tender to palpation, positive bowel sounds, no guarding, no rebound  Skin: no rashes  Musculoskeletal: normal bulk and tone, no joint swelling  Psychiatric: normal mood and affect  Neurologic: CN 2-12 grossly intact, MS 5/5 in all 4  Labs on Admission:  Basic Metabolic Panel:  Recent Labs Lab 09/05/14 1006  NA 128*  K 4.0  CL 95*  CO2 24  GLUCOSE 100*  BUN 15  CREATININE 0.98  CALCIUM 8.5*   Liver Function Tests: No results for input(s): AST, ALT, ALKPHOS, BILITOT, PROT, ALBUMIN in the last 168 hours. No results for input(s): LIPASE, AMYLASE in the last 168 hours. No results for input(s): AMMONIA in the last 168 hours. CBC:  Recent Labs Lab 09/05/14 1006  WBC 6.8  HGB 12.7*  HCT 37.8*  MCV 97.4  PLT 166   Cardiac Enzymes:  Recent Labs Lab 09/05/14 1006  TROPONINI <0.03    BNP (last 3 results) No results for input(s): BNP in the last 8760 hours.  ProBNP (last 3 results) No results for input(s): PROBNP in the last 8760 hours.  CBG: No results for input(s): GLUCAP in the last 168 hours.  Radiological Exams on Admission: Dg Chest Port 1 View  09/05/2014  CLINICAL DATA:  Two day history of chest pain  EXAM: PORTABLE CHEST - 1 VIEW  COMPARISON:  01/29/2013  FINDINGS: The cardiac silhouette, mediastinal and hilar contours are within normal limits and stable. There is tortuosity and calcification of the thoracic aorta. The lungs are clear. No pleural effusion. The bony thorax is intact.  IMPRESSION: No acute cardiopulmonary findings.   Electronically Signed   By: Marijo Sanes M.D.   On: 09/05/2014 11:00    EKG: Independently  reviewed.  Assessment/Plan Principal Problem:   Unstable angina Active Problems:   ASCVD (arteriosclerotic cardiovascular disease)   Abnormal EKG   Will observe on telemetry and follow enzymes. Order echo and Cardiology consult. Morphine and SL NTG as needed. Repeat routine labs in AM.  Diet: clear liquid Fluids: NS@100  DVT Prophylaxis: Lovenox  Code Status: FULL  Family Communication: yes  Disposition Plan: home  Time spent: 45 min

## 2014-09-05 NOTE — ED Notes (Signed)
Patient began to complain of weakness and dizziness following nitroglycerin. Patient blood pressure decreased to 67/53. Corky Downs, MD notified. Patient placed in Trendelenburg and IV bolus started. Patient remained alert and oriented x4.

## 2014-09-06 ENCOUNTER — Observation Stay: Payer: Medicare PPO

## 2014-09-06 LAB — BASIC METABOLIC PANEL
Anion gap: 5 (ref 5–15)
BUN: 10 mg/dL (ref 6–20)
CALCIUM: 8.1 mg/dL — AB (ref 8.9–10.3)
CHLORIDE: 107 mmol/L (ref 101–111)
CO2: 24 mmol/L (ref 22–32)
Creatinine, Ser: 0.68 mg/dL (ref 0.61–1.24)
GLUCOSE: 82 mg/dL (ref 65–99)
Potassium: 3.7 mmol/L (ref 3.5–5.1)
Sodium: 136 mmol/L (ref 135–145)

## 2014-09-06 LAB — CBC
HCT: 36.1 % — ABNORMAL LOW (ref 40.0–52.0)
HEMOGLOBIN: 11.9 g/dL — AB (ref 13.0–18.0)
MCH: 32.3 pg (ref 26.0–34.0)
MCHC: 33.1 g/dL (ref 32.0–36.0)
MCV: 97.7 fL (ref 80.0–100.0)
Platelets: 157 10*3/uL (ref 150–440)
RBC: 3.69 MIL/uL — ABNORMAL LOW (ref 4.40–5.90)
RDW: 14.4 % (ref 11.5–14.5)
WBC: 4 10*3/uL (ref 3.8–10.6)

## 2014-09-06 LAB — NM MYOCAR MULTI W/SPECT W/WALL MOTION / EF
LV dias vol: 67 mL
LVSYSVOL: 29 mL
SDS: 2
SRS: 0
SSS: 2
TID: 1.11

## 2014-09-06 LAB — TROPONIN I

## 2014-09-06 MED ORDER — I-VITE PROTECT PO TABS
1.0000 | ORAL_TABLET | Freq: Every day | ORAL | Status: DC
  Administered 2014-09-06 – 2014-09-07 (×2): 1 via ORAL
  Filled 2014-09-06 (×2): qty 1

## 2014-09-06 MED ORDER — SENNOSIDES-DOCUSATE SODIUM 8.6-50 MG PO TABS
2.0000 | ORAL_TABLET | Freq: Two times a day (BID) | ORAL | Status: DC
  Administered 2014-09-06 – 2014-09-07 (×3): 2 via ORAL
  Filled 2014-09-06 (×3): qty 2

## 2014-09-06 MED ORDER — TECHNETIUM TC 99M SESTAMIBI - CARDIOLITE
33.0000 | Freq: Once | INTRAVENOUS | Status: AC | PRN
  Administered 2014-09-06: 10:00:00 32.648 via INTRAVENOUS

## 2014-09-06 MED ORDER — PANTOPRAZOLE SODIUM 40 MG PO TBEC
40.0000 mg | DELAYED_RELEASE_TABLET | Freq: Two times a day (BID) | ORAL | Status: DC
  Administered 2014-09-06 – 2014-09-07 (×2): 40 mg via ORAL
  Filled 2014-09-06 (×2): qty 1

## 2014-09-06 MED ORDER — REGADENOSON 0.4 MG/5ML IV SOLN
0.4000 mg | Freq: Once | INTRAVENOUS | Status: AC
  Administered 2014-09-06: 0.4 mg via INTRAVENOUS
  Filled 2014-09-06: qty 5

## 2014-09-06 MED ORDER — ATORVASTATIN CALCIUM 20 MG PO TABS
40.0000 mg | ORAL_TABLET | Freq: Every day | ORAL | Status: DC
Start: 2014-09-06 — End: 2014-09-07
  Administered 2014-09-06: 40 mg via ORAL
  Filled 2014-09-06: qty 2

## 2014-09-06 MED ORDER — TECHNETIUM TC 99M SESTAMIBI - CARDIOLITE
13.0000 | Freq: Once | INTRAVENOUS | Status: AC | PRN
  Administered 2014-09-06: 09:00:00 13.66 via INTRAVENOUS

## 2014-09-06 NOTE — Progress Notes (Signed)
MD, Dr. Jannifer Franklin notified. Pt has atenolol 50 mg scheduled for evening medication.  Pt's HR has been bradycardic 55-58.  MD ordered to hold evening dose.  Monitor BP.

## 2014-09-06 NOTE — Progress Notes (Signed)
Notified Dr. Reece Levy of 9 beat run of Vtach. No new orders, will continue to monitor.

## 2014-09-06 NOTE — Consult Note (Signed)
[facility]  CARDIOLOGY CONSULT NOTE  Patient ID: Brad Brady MRN: [id number] DOB/AGE: 1927/12/04 81 y.o.  Admit date: 09/05/2014 Referring Physician Dr. Manuella Ghazi Primary Physician   Dr. Jenny Reichmann walker Primary Cardiologist Dr. Saralyn Pilar Reason for Consultation Chest pain  HPI:  Patient is an 81 year old male with history of coronary artery disease, hypertension, hyperlipidemia as well as peripheral vascular disease he has a history of a PTCA in 1994. He also has a history of herniorrhaphy.  He has a 3.29 x 3.23 cm abdominal aortic aneurysm.  He has been doing fairly well from a cardiac standpoint as well as from an ischemic standpoint.  Outpatient medical regimen includes atorvastatin at 20 mg daily for hyperlipidemia, atenolol at 50 mg daily and enteric-coated aspirin.  He was admitted with complaints of chest discomfort.  He had been doing well and stated he over did it in the hot environment several days ago.  He developed some nasal congestion which he often has a long with some chest tightness.  He presented to the emergency room where electrocardiogram revealed   Sinus bradycardia with PVCs. There were no PVCs.  He has improved with less symptoms presently.  His last functional study was several years ago.  ROS Review of Systems - History obtained from chart review and the patient General ROS: positive for  - fatigue and Nasal congestion chest tightness Respiratory ROS: no cough, shortness of breath, or wheezing Cardiovascular ROS: positive for - chest pain Gastrointestinal ROS: no abdominal pain, change in bowel habits, or black or bloody stools Musculoskeletal ROS: negative Neurological ROS: no TIA or stroke symptoms   Past Medical History  Diagnosis Date  . Hypertension   . Thyroid disease   . High cholesterol   . BPH (benign prostatic hyperplasia)   . GERD (gastroesophageal reflux disease)   . Coronary artery disease     History reviewed. No  pertinent family history.  History   Social History  . Marital Status: Married    Spouse Name: N/A  . Number of Children: N/A  . Years of Education: N/A   Occupational History  . Not on file.   Social History Main Topics  . Smoking status: Never Smoker   . Smokeless tobacco: Not on file  . Alcohol Use: No  . Drug Use: No  . Sexual Activity: Not on file   Other Topics Concern  . Not on file   Social History Narrative    Past Surgical History  Procedure Laterality Date  . Hernia repair       Prescriptions prior to admission  Medication Sig Dispense Refill Last Dose  . acetaminophen (TYLENOL) 500 MG tablet Take 1,000 mg by mouth 3 (three) times daily.   09/05/2014 at Unknown time  . aspirin EC 81 MG tablet Take 81 mg by mouth every other day.   09/05/2014 at Unknown time  . atenolol (TENORMIN) 50 MG tablet Take 50 mg by mouth daily.   09/04/2014 at 2230  . atorvastatin (LIPITOR) 20 MG tablet Take 20 mg by mouth at bedtime.   09/04/2014 at Unknown time  . Cholecalciferol (VITAMIN D3) 2000 UNITS capsule Take 2,000 Units by mouth every morning.   09/02/2014  . dutasteride (AVODART) 0.5 MG capsule Take 0.5 mg by mouth daily.   09/04/2014 at Unknown time  . esomeprazole (NEXIUM) 40 MG capsule Take 40 mg by mouth daily as needed (for acid reflux.).    09/04/2014 at Unknown time  . Multiple  Vitamins-Minerals (PRESERVISION/LUTEIN PO) Take 1 tablet by mouth daily.   09/02/2014  . SYNTHROID 100 MCG tablet Take 100 mcg by mouth at bedtime.   09/04/2014 at Unknown time  . tamsulosin (FLOMAX) 0.4 MG CAPS capsule Take 0.4 mg by mouth daily.   09/04/2014 at Unknown time    Physical Exam: Blood pressure 115/71, pulse 57, temperature 97.3 F (36.3 C), temperature source Oral, resp. rate 18, height 5\' 10"  (1.778 m), weight 76.34 kg (168 lb 4.8 oz), SpO2 98 %.    General appearance: alert and cooperative Head: Normocephalic, without obvious abnormality, atraumatic Neck: no adenopathy, no carotid bruit, no  JVD, supple, symmetrical, trachea midline and thyroid not enlarged, symmetric, no tenderness/mass/nodules Resp: clear to auscultation bilaterally Chest wall: no tenderness Cardio: regular rate and rhythm, S1, S2 normal, no murmur, click, rub or gallop GI: soft, non-tender; bowel sounds normal; no masses,  no organomegaly Extremities: extremities normal, atraumatic, no cyanosis or edema Pulses: 2+ and symmetric Neurologic: Grossly normal Labs:   Lab Results  Component Value Date   WBC 4.0 09/06/2014   HGB 11.9* 09/06/2014   HCT 36.1* 09/06/2014   MCV 97.7 09/06/2014   PLT 157 09/06/2014    Recent Labs Lab 09/06/14 0039  NA 136  K 3.7  CL 107  CO2 24  BUN 10  CREATININE 0.68  CALCIUM 8.1*  GLUCOSE 82   Lab Results  Component Value Date   TROPONINI <0.03 09/06/2014      Radiology:   No acute process on chest x-ray EKG:  Sinus bradycardia with a PVC.  Nonischemic   ASSESSMENT AND PLAN:    1.-chest pain- patient is rule out from myocardia infarction.  EKG is unremarkable.  Chest pain has both typical atypical features for angina.  Will proceed with a functional study to  Evaluate for possible ischemic etiology.  If this is abnormal would proceed left cardiac catheterization.  If unremarkable for ischemia,  Will determine alternative etiology.  Patient will continue with atenolol, aspirin for now.  2. Hyperlipidemia-continue with atorvastatin with an LDL goal of less than 100.  3. Further recommendations after studies are completed.   Signed: Teodoro Spray MD, [facility] 09/06/2014, 9:07 AM

## 2014-09-06 NOTE — Progress Notes (Signed)
[place] [facility] [place] NAME: Brad Brady    MR#:  [id number]  DATE OF BIRTH:  1927-04-19  SUBJECTIVE:  CHIEF COMPLAINT:   Chief Complaint  Patient presents with  . Chest Pain   feeling much better, has minimal chest pressure  REVIEW OF SYSTEMS:  Review of Systems  Constitutional: Negative for fever, weight loss, malaise/fatigue and diaphoresis.  HENT: Negative for ear discharge, ear pain, hearing loss, nosebleeds, sore throat and tinnitus.   Eyes: Negative for blurred vision and pain.  Respiratory: Negative for cough, hemoptysis, shortness of breath and wheezing.   Cardiovascular: Positive for chest pain. Negative for palpitations, orthopnea and leg swelling.  Gastrointestinal: Positive for constipation. Negative for heartburn, nausea, vomiting, abdominal pain, diarrhea and blood in stool.  Genitourinary: Negative for dysuria, urgency and frequency.  Musculoskeletal: Negative for myalgias and back pain.  Skin: Negative for itching and rash.  Neurological: Negative for dizziness, tingling, tremors, focal weakness, seizures, weakness and headaches.  Psychiatric/Behavioral: Negative for depression. The patient is not nervous/anxious.    DRUG ALLERGIES:  No Known Allergies VITALS:  Blood pressure 140/72, pulse 62, temperature 97.9 F (36.6 C), temperature source Oral, resp. rate 19, height 5\' 10"  (1.778 m), weight 76.34 kg (168 lb 4.8 oz), SpO2 100 %. PHYSICAL EXAMINATION:  Physical Exam  Constitutional: He is oriented to person, place, and time and well-developed, well-nourished, and in no distress.  HENT:  Head: Normocephalic and atraumatic.  Eyes: Conjunctivae and EOM are normal. Pupils are equal, round, and reactive to light.  Neck: Normal range of motion. Neck supple. No tracheal deviation present. No thyromegaly present.  Cardiovascular: Normal rate, regular rhythm and normal heart sounds.   Pulmonary/Chest: Effort normal and  breath sounds normal. No respiratory distress. He has no wheezes. He exhibits no tenderness.  Abdominal: Soft. Bowel sounds are normal. He exhibits no distension. There is no tenderness.  Musculoskeletal: Normal range of motion.  Neurological: He is alert and oriented to person, place, and time. No cranial nerve deficit.  Skin: Skin is warm and dry. No rash noted.  Psychiatric: Mood and affect normal.   LABORATORY PANEL:   CBC  Recent Labs Lab 09/06/14 0039  WBC 4.0  HGB 11.9*  HCT 36.1*  PLT 157   ------------------------------------------------------------------------------------------------------------------ Chemistries   Recent Labs Lab 09/06/14 0039  NA 136  K 3.7  CL 107  CO2 24  GLUCOSE 82  BUN 10  CREATININE 0.68  CALCIUM 8.1*   RADIOLOGY:  Nm Myocar Multi W/spect W/wall Motion / Ef  09/06/2014    The study is normal.  This is a low risk study.  The left ventricular ejection fraction is normal (55-65%).   Normal lexiscan stress with no ischemia and normal lv funciton   ASSESSMENT AND PLAN:   * Unstable Angina: Negative stress test.  Pending 2-D echo.  Appreciate cardiology input.   * Cardiac arrhythmias: 9 beats of ventricular tachycardia last night, also had some on and off pauses during the day today. Cardiology aware.  Monitor on telemetry  * Hyperlipidemia: Continue statin, check FLP  * Constipation: Add senna with Colace.   All the records are reviewed and case discussed with Care Management/Social Worker & Dr. Ubaldo Glassing. Management plans discussed with the patient, family and they are in agreement.  CODE STATUS: Full Code  TOTAL TIME TAKING CARE OF THIS PATIENT: 35 minutes.   More than 50% of the time was spent in counseling/coordination of care: YES  POSSIBLE D/C IN 1-2 DAYS, DEPENDING ON CLINICAL CONDITION.   [facility], Cearra Portnoy M.D on 09/06/2014 at 3:53 PM  Between 7am to 6pm - Pager - [phone]  After 6pm go to [email or web address] - password [email or web address]  [facility]  Office  [phone]  CC:  Primary care physician; Madelyn Brunner, MD

## 2014-09-07 ENCOUNTER — Observation Stay
Admit: 2014-09-07 | Discharge: 2014-09-07 | Disposition: A | Discharge status: Home / Self Care | Payer: Medicare PPO | Attending: Cardiology | Admitting: Cardiology

## 2014-09-07 LAB — LIPID PANEL
CHOL/HDL RATIO: 3.3 ratio
Cholesterol: 136 mg/dL (ref 0–200)
HDL: 41 mg/dL (ref >40)
LDL CALC: 81 mg/dL (ref 0–99)
TRIGLYCERIDES: 68 mg/dL (ref <150)
VLDL: 14 mg/dL (ref 0–40)

## 2014-09-07 MED ORDER — ATENOLOL 25 MG PO TABS: 25.0000 mg | ORAL_TABLET | Freq: Every day | ORAL | Status: DC

## 2014-09-07 MED ORDER — SENNOSIDES-DOCUSATE SODIUM 8.6-50 MG PO TABS: 2.0000 | ORAL_TABLET | Freq: Every evening | ORAL | Status: DC | PRN

## 2014-09-07 MED ORDER — POTASSIUM CHLORIDE CRYS ER 20 MEQ PO TBCR
20.0000 meq | EXTENDED_RELEASE_TABLET | Freq: Once | ORAL | Status: AC
  Administered 2014-09-07: 20 meq via ORAL
  Filled 2014-09-07: qty 1

## 2014-09-07 NOTE — Plan of Care (Signed)
Problem: Discharge Progression Outcomes Goal: Other Discharge Outcomes/Goals Outcome: Completed/Met Date Met:  09/07/14 Pt is alert and oriented x 4, pt denies pain but c/o chest discomfort and abdominal discomfort, pt expressed with Dr. Ubaldo Glassing. Pt denies n/v, no emesis, good appetite, on room air, vital signs stable, receiving aspirin, no vascular access site, up ad lib in room, bm throughout shift per pt, pt is d/c to home with holter monitor for 48 hours, d/c instructions reviewed with patient, pt is to f/u with pcp and cardiologist, appointments schedulded, atenolol dosage changed, discussed with patient. Uneventful shift.

## 2014-09-07 NOTE — Discharge Instructions (Signed)
Angina Pectoris  Angina pectoris is extreme discomfort in your chest, neck, or arm. Your doctor may call it just angina. It is caused by a lack of oxygen to your heart wall. It may feel like tightness or heavy pressure. It may feel like a crushing or squeezing pain. Some people say it feels like gas. It may go down your shoulders, back, and arms. Some people have symptoms other than pain. These include:  · Tiredness.  · Shortness of breath.  · Cold sweats.  · Feeling sick to your stomach (nausea).  There are four types of angina:  · Stable angina. This type often lasts the same amount of time each time it happens. Activity, stress, or excitement can bring it on. It often gets better after taking a medicine called nitroglycerin. This goes under your tongue.  · Unstable angina. This type can happen when you are not active or even during sleep. It can suddenly get worse or happen more often. It may not get better after taking the special medicine. It can last up to 30 minutes.  · Microvascular angina. This type is more common in women. It may be more severe or last longer than other types.  · Prinzmetal angina. This type often happens when you are not active or in the early morning hours.  HOME CARE   · Only take medicines as told by your doctor.  · Stay active or exercise more as told by your doctor.  · Limit very hard activity as told by your doctor.  · Limit heavy lifting as told by your doctor.  · Keep a healthy weight.  · Learn about and eat foods that are healthy for your heart.  · Do not use any tobacco such as cigarettes, chewing tobacco, or e-cigarettes.  GET HELP RIGHT AWAY IF:   · You have chest, neck, deep shoulder, or arm pain or discomfort that lasts more than a few minutes.  · You have chest, neck, deep shoulder, or arm pain or discomfort that goes away and comes back over and over again.  · You have heavy sweating that seems to happen for no reason.  · You have shortness of breath or trouble  breathing.  · Your angina does not get better after a few minutes of rest.  · Your angina does not get better after you take nitroglycerin medicine.  These can all be symptoms of a heart attack. Get help right away. Call your local emergency service (911 in [place] Do not  drive yourself to the hospital. Do not  wait to for your symptoms to go away.  MAKE SURE YOU:   · Understand these instructions.  · Will watch your condition.  · Will get help right away if you are not doing well or get worse.  Document Released: 08/01/2007 Document Revised: 02/17/2013 Document Reviewed: 06/16/2013  ExitCare® Patient Information ©2015 [employer]. This information is not intended to replace advice given to you by your health care provider. Make sure you discuss any questions you have with your health care provider.

## 2014-09-07 NOTE — Care Management (Signed)
Patient eager to return home with his "chick" (he refers to his wife). He denies home health needs. Patient has discharge order for home today. Case closed.

## 2014-09-07 NOTE — Progress Notes (Signed)
[facility]  SUBJECTIVE: Continues to have mild nausea. Mild epigastric discomfort. His ruled out for myocardial infarction with normal troponins. Functional study revealed no reversible ischemia. No prolonged pauses or tachyarrhythmias noted in the last 24 hours. Serum potassium low normal. Will replace. Episodes of intermittent bradycardia, and we'll reduce atenolol to 25 mg daily.   Filed Vitals:   09/06/14 2112 09/07/14 0000 09/07/14 0445 09/07/14 0558  BP: 150/70 127/73 132/81   Pulse: 57 56 56   Temp: 98 F (36.7 C)  98 F (36.7 C)   TempSrc: Oral  Oral   Resp: 18  20   Height:      Weight:    76.794 kg (169 lb 4.8 oz)  SpO2: 99% 97% 99%     Intake/Output Summary (Last 24 hours) at 09/07/14 0842 Last data filed at 09/07/14 0742  Gross per 24 hour  Intake 1406.67 ml  Output   4125 ml  Net -2718.33 ml    LABS: Basic Metabolic Panel:  Recent Labs  09/05/14 1006 09/06/14 0039  NA 128* 136  K 4.0 3.7  CL 95* 107  CO2 24 24  GLUCOSE 100* 82  BUN 15 10  CREATININE 0.98 0.68  CALCIUM 8.5* 8.1*   Liver Function Tests: No results for input(s): AST, ALT, ALKPHOS, BILITOT, PROT, ALBUMIN in the last 72 hours. No results for input(s): LIPASE, AMYLASE in the last 72 hours. CBC:  Recent Labs  09/05/14 1006 09/06/14 0039  WBC 6.8 4.0  HGB 12.7* 11.9*  HCT 37.8* 36.1*  MCV 97.4 97.7  PLT 166 157   Cardiac Enzymes:  Recent Labs  09/05/14 1304 09/05/14 1859 09/06/14 0039  TROPONINI <0.03 <0.03 <0.03   BNP: Invalid input(s): POCBNP D-Dimer: No results for input(s): DDIMER in the last 72 hours. Hemoglobin A1C: No results for input(s): HGBA1C in the last 72 hours. Fasting Lipid Panel:  Recent Labs  09/07/14 0448  CHOL 136  HDL 41  LDLCALC 81  TRIG 68  CHOLHDL 3.3   Thyroid Function Tests: No results for input(s): TSH, T4TOTAL, T3FREE, THYROIDAB in the last 72 hours.  Invalid input(s): FREET3 Anemia Panel: No  results for input(s): VITAMINB12, FOLATE, FERRITIN, TIBC, IRON, RETICCTPCT in the last 72 hours.   Physical Exam: Blood pressure 132/81, pulse 56, temperature 98 F (36.7 C), temperature source Oral, resp. rate 20, height 5\' 10"  (1.778 m), weight 76.794 kg (169 lb 4.8 oz), SpO2 99 %.    General appearance: alert and cooperative Resp: clear to auscultation bilaterally Cardio: regular rate and rhythm GI: soft, non-tender; bowel sounds normal; no masses,  no organomegaly Extremities: extremities normal, atraumatic, no cyanosis or edema Pulses: 2+ and symmetric Neurologic: Grossly normal  TELEMETRY: Reviewed telemetry pt in sinus rhythm with intermitant pvc and bradycardia.  ASSESSMENT AND PLAN:  Principal Problem:   Unstable angina-his ruled out for myocardial infarction. Functional study is negative for ischemia. Epigastric discomfort does not appear to be ischemia. Will ambulate this morning and consider discharge with outpatient follow-up with his primary care provider and his primary cardiologist Dr. Saralyn Pilar. Also discharge on atorvastatin 40 mg daily, levothyroxine 100 g daily, will give one dose of potassium chloride at 20 mg daily for a serum potassium of 3.7. Active Problems:   ASCVD (arteriosclerotic cardiovascular disease)-as per above   Abnormal EKG-intermittent bradycardia with intermittent PVCs and nonsustained wide-complex tachycardia. We'll reduce atenolol to 25 mg daily and ambulate. We'll place 48 hour Holter monitor at discharge in follow  for evidence of arrhythmia    Teodoro Spray., MD, [facility] 09/07/2014 8:42 AM

## 2014-09-08 NOTE — Discharge Summary (Signed)
[facility] [place] NAME: Brad Brady    MR#:  [id number]  DATE OF BIRTH:  Apr 29, 1927  DATE OF ADMISSION:  09/05/2014 ADMITTING PHYSICIAN: Idelle Crouch, MD  DATE OF DISCHARGE: 09/07/2014  3:54 PM  PRIMARY CARE PHYSICIAN: Madelyn Brunner, MD  CARDIOLOGY: Dr. Saralyn Pilar  ADMISSION DIAGNOSIS:  Chest pain, unspecified chest pain type [R07.9]  DISCHARGE DIAGNOSIS:  Principal Problem:   Unstable angina Active Problems:   ASCVD (arteriosclerotic cardiovascular disease)   Abnormal EKG  SECONDARY DIAGNOSIS:   Past Medical History  Diagnosis Date  . Hypertension   . Thyroid disease   . High cholesterol   . BPH (benign prostatic hyperplasia)   . GERD (gastroesophageal reflux disease)   . Coronary artery disease    HOSPITAL COURSE:  81 y.o. male has a past medical history significant for ASCVD, HTN, GERD who presents with 24h hx of CP described as pressure brought on by exertion. Please see Dr Stacie Glaze dictated [place] for further details. He was evaluated by Dr Ubaldo Glassing from Cardiology and ruled out with serial troponins. Dr Ubaldo Glassing recommended Echo and Myoview which were performed and were negative. Patient had some PVC's and short run of V.tach while in Hospital for which he was monitored closely on tele and was recommended to keep his K close to 4.0. His overall heart rate remained low so on the date of discharge Cardiology recommended to cut back on the dose of atenolol and after ambulation he was discharged home as he was feeling much better and didn't have any other symptoms. He was also found to have constipation and was prescribed stool softners on discharge.  He was discharged home in stable condition and he was agreeable with discharge plans. DISCHARGE CONDITIONS:  STABLE CONSULTS OBTAINED:  Treatment Team:  Teodoro Spray, MD DRUG ALLERGIES:  No Known Allergies DISCHARGE MEDICATIONS:   Discharge Medication List as of  09/07/2014  2:05 PM    START taking these medications   Details  senna-docusate (SENOKOT-S) 8.6-50 MG per tablet Take 2 tablets by mouth at bedtime as needed for mild constipation., Starting 09/07/2014, Until Discontinued, Normal      CONTINUE these medications which have CHANGED   Details  atenolol (TENORMIN) 25 MG tablet Take 1 tablet (25 mg total) by mouth daily., Starting 09/07/2014, Until Discontinued, Normal      CONTINUE these medications which have NOT CHANGED   Details  acetaminophen (TYLENOL) 500 MG tablet Take 1,000 mg by mouth 3 (three) times daily., Until Discontinued, Historical Med    aspirin EC 81 MG tablet Take 81 mg by mouth every other day., Until Discontinued, Historical Med    atorvastatin (LIPITOR) 20 MG tablet Take 20 mg by mouth at bedtime., Starting 06/03/2014, Until Discontinued, Historical Med    Cholecalciferol (VITAMIN D3) 2000 UNITS capsule Take 2,000 Units by mouth every morning., Until Discontinued, Historical Med    dutasteride (AVODART) 0.5 MG capsule Take 0.5 mg by mouth daily., Starting 07/27/2014, Until Discontinued, Historical Med    esomeprazole (NEXIUM) 40 MG capsule Take 40 mg by mouth daily as needed (for acid reflux.). , Starting 06/03/2014, Until Discontinued, Historical Med    Multiple Vitamins-Minerals (PRESERVISION/LUTEIN PO) Take 1 tablet by mouth daily., Until Discontinued, Historical Med    SYNTHROID 100 MCG tablet Take 100 mcg by mouth at bedtime., Starting 06/03/2014, Until Discontinued, Historical Med    tamsulosin (FLOMAX) 0.4 MG CAPS capsule Take 0.4 mg by  mouth daily., Starting 07/27/2014, Until Discontinued, Historical Med       DIET:  Cardiac diet DISCHARGE CONDITION:  Good ACTIVITY:  Activity as tolerated OXYGEN:  Home Oxygen: No.  Oxygen Delivery: room air DISCHARGE LOCATION:  home   If you experience worsening of your admission symptoms, develop shortness of breath, life threatening emergency, suicidal or homicidal  thoughts you must seek medical attention immediately by calling 911 or calling your MD immediately  if symptoms less severe.  You Must read complete instructions/literature along with all the possible adverse reactions/side effects for all the Medicines you take and that have been prescribed to you. Take any new Medicines after you have completely understood and accpet all the possible adverse reactions/side effects.   Please note  You were cared for by a hospitalist during your hospital stay. If you have any questions about your discharge medications or the care you received while you were in the hospital after you are discharged, you can call the unit and asked to speak with the hospitalist on call if the hospitalist that took care of you is not available. Once you are discharged, your primary care physician will handle any further medical issues. Please note that NO REFILLS for any discharge medications will be authorized once you are discharged, as it is imperative that you return to your primary care physician (or establish a relationship with a primary care physician if you do not have one) for your aftercare needs so that they can reassess your need for medications and monitor your lab values.  On the day of Discharge: VITAL SIGNS:  Blood pressure 123/71, pulse 68, temperature 98.2 F (36.8 C), temperature source Oral, resp. rate 20, height 5\' 10"  (1.778 m), weight 76.794 kg (169 lb 4.8 oz), SpO2 100 %. PHYSICAL EXAMINATION:  GENERAL:  81 y.o.-year-old patient lying in the bed with no acute distress.  EYES: Pupils equal, round, reactive to light and accommodation. No scleral icterus. Extraocular muscles intact.  HEENT: Head atraumatic, normocephalic. Oropharynx and nasopharynx clear.  NECK:  Supple, no jugular venous distention. No thyroid enlargement, no tenderness.  LUNGS: Normal breath sounds bilaterally, no wheezing, rales,rhonchi or crepitation. No use of accessory muscles of  respiration.  CARDIOVASCULAR: S1, S2 normal. No murmurs, rubs, or gallops.  ABDOMEN: Soft, non-tender, non-distended. Bowel sounds present. No organomegaly or mass.  EXTREMITIES: No pedal edema, cyanosis, or clubbing.  NEUROLOGIC: Cranial nerves II through XII are intact. Muscle strength 5/5 in all extremities. Sensation intact. Gait not checked.  PSYCHIATRIC: The patient is alert and oriented x 3.  SKIN: No obvious rash, lesion, or ulcer.  DATA REVIEW:   CBC  Recent Labs Lab 09/06/14 0039  WBC 4.0  HGB 11.9*  HCT 36.1*  PLT 157    Chemistries   Recent Labs Lab 09/06/14 0039  NA 136  K 3.7  CL 107  CO2 24  GLUCOSE 82  BUN 10  CREATININE 0.68  CALCIUM 8.1*    Management plans discussed with the patient, family and they are in agreement.  CODE STATUS: DNR   TOTAL TIME TAKING CARE OF THIS PATIENT: 55 minutes.   He remains at high risk for readmissions.  [facility], Rockie Vawter M.D on 09/08/2014 at 11:49 AM  Between 7am to 6pm - Pager - [phone]  After 6pm go to [email or web address] - password [email or web address] [place]  Office  [phone]  CC: Primary care physician; Madelyn Brunner, MD CARDIOLOGY: Dr. Saralyn Pilar

## 2014-10-19 ENCOUNTER — Encounter: Payer: Self-pay | Admitting: *Deleted

## 2014-10-20 ENCOUNTER — Encounter
Admission: RE | Disposition: A | Discharge status: Home / Self Care | Payer: Self-pay | Source: Ambulatory Visit | Attending: Unknown Physician Specialty

## 2014-10-20 ENCOUNTER — Ambulatory Visit
Admission: RE | Admit: 2014-10-20 | Discharge: 2014-10-20 | Disposition: A | Discharge status: Home / Self Care | Payer: Medicare PPO | Source: Ambulatory Visit | Attending: Unknown Physician Specialty | Admitting: Unknown Physician Specialty

## 2014-10-20 ENCOUNTER — Encounter: Payer: Self-pay | Admitting: Anesthesiology

## 2014-10-20 ENCOUNTER — Ambulatory Visit: Payer: Medicare PPO | Admitting: Anesthesiology

## 2014-10-20 DIAGNOSIS — Z8249 Family history of ischemic heart disease and other diseases of the circulatory system: Secondary | ICD-10-CM | POA: Diagnosis not present

## 2014-10-20 DIAGNOSIS — Z7982 Long term (current) use of aspirin: Secondary | ICD-10-CM | POA: Insufficient documentation

## 2014-10-20 DIAGNOSIS — K5900 Constipation, unspecified: Secondary | ICD-10-CM | POA: Insufficient documentation

## 2014-10-20 DIAGNOSIS — K589 Irritable bowel syndrome without diarrhea: Secondary | ICD-10-CM | POA: Diagnosis not present

## 2014-10-20 DIAGNOSIS — K640 First degree hemorrhoids: Secondary | ICD-10-CM | POA: Insufficient documentation

## 2014-10-20 DIAGNOSIS — K319 Disease of stomach and duodenum, unspecified: Secondary | ICD-10-CM | POA: Diagnosis not present

## 2014-10-20 DIAGNOSIS — D509 Iron deficiency anemia, unspecified: Secondary | ICD-10-CM | POA: Diagnosis not present

## 2014-10-20 DIAGNOSIS — Z79899 Other long term (current) drug therapy: Secondary | ICD-10-CM | POA: Insufficient documentation

## 2014-10-20 DIAGNOSIS — K449 Diaphragmatic hernia without obstruction or gangrene: Secondary | ICD-10-CM | POA: Diagnosis not present

## 2014-10-20 DIAGNOSIS — E079 Disorder of thyroid, unspecified: Secondary | ICD-10-CM | POA: Diagnosis not present

## 2014-10-20 DIAGNOSIS — I714 Abdominal aortic aneurysm, without rupture: Secondary | ICD-10-CM | POA: Insufficient documentation

## 2014-10-20 DIAGNOSIS — K5730 Diverticulosis of large intestine without perforation or abscess without bleeding: Secondary | ICD-10-CM | POA: Insufficient documentation

## 2014-10-20 DIAGNOSIS — K219 Gastro-esophageal reflux disease without esophagitis: Secondary | ICD-10-CM | POA: Insufficient documentation

## 2014-10-20 DIAGNOSIS — R110 Nausea: Secondary | ICD-10-CM | POA: Insufficient documentation

## 2014-10-20 DIAGNOSIS — K2970 Gastritis, unspecified, without bleeding: Secondary | ICD-10-CM | POA: Diagnosis not present

## 2014-10-20 DIAGNOSIS — E780 Pure hypercholesterolemia: Secondary | ICD-10-CM | POA: Insufficient documentation

## 2014-10-20 DIAGNOSIS — I2510 Atherosclerotic heart disease of native coronary artery without angina pectoris: Secondary | ICD-10-CM | POA: Insufficient documentation

## 2014-10-20 DIAGNOSIS — R195 Other fecal abnormalities: Secondary | ICD-10-CM | POA: Insufficient documentation

## 2014-10-20 DIAGNOSIS — R1013 Epigastric pain: Secondary | ICD-10-CM | POA: Insufficient documentation

## 2014-10-20 DIAGNOSIS — N400 Enlarged prostate without lower urinary tract symptoms: Secondary | ICD-10-CM | POA: Diagnosis not present

## 2014-10-20 DIAGNOSIS — I10 Essential (primary) hypertension: Secondary | ICD-10-CM | POA: Diagnosis not present

## 2014-10-20 DIAGNOSIS — E559 Vitamin D deficiency, unspecified: Secondary | ICD-10-CM | POA: Insufficient documentation

## 2014-10-20 HISTORY — PX: ESOPHAGOGASTRODUODENOSCOPY (EGD) WITH PROPOFOL: SHX5813

## 2014-10-20 HISTORY — DX: Irritable bowel syndrome, unspecified: K58.9

## 2014-10-20 HISTORY — PX: COLONOSCOPY WITH PROPOFOL: SHX5780

## 2014-10-20 HISTORY — DX: Other intervertebral disc displacement, thoracic region: M51.24

## 2014-10-20 HISTORY — DX: Anemia, unspecified: D64.9

## 2014-10-20 HISTORY — DX: Vitamin D deficiency, unspecified: E55.9

## 2014-10-20 SURGERY — ESOPHAGOGASTRODUODENOSCOPY (EGD) WITH PROPOFOL
Anesthesia: General

## 2014-10-20 MED ORDER — PROPOFOL 10 MG/ML IV BOLUS
INTRAVENOUS | Status: DC | PRN
  Administered 2014-10-20: 15 mg via INTRAVENOUS
  Administered 2014-10-20: 30 mg via INTRAVENOUS

## 2014-10-20 MED ORDER — SODIUM CHLORIDE 0.9 % IV SOLN
INTRAVENOUS | Status: DC
Start: 2014-10-20 — End: 2014-10-20
  Administered 2014-10-20: 1000 mL via INTRAVENOUS
  Administered 2014-10-20: 11:00:00 via INTRAVENOUS

## 2014-10-20 MED ORDER — LIDOCAINE HCL (CARDIAC) 20 MG/ML IV SOLN
INTRAVENOUS | Status: DC | PRN
  Administered 2014-10-20: 80 mg via INTRAVENOUS

## 2014-10-20 MED ORDER — SODIUM CHLORIDE 0.9 % IV SOLN: INTRAVENOUS | Status: DC

## 2014-10-20 MED ORDER — GLYCOPYRROLATE 0.2 MG/ML IJ SOLN
INTRAMUSCULAR | Status: DC | PRN
  Administered 2014-10-20: 0.2 mg via INTRAVENOUS

## 2014-10-20 MED ORDER — PROPOFOL INFUSION 10 MG/ML OPTIME
INTRAVENOUS | Status: DC | PRN
  Administered 2014-10-20: 100 ug/kg/min via INTRAVENOUS

## 2014-10-20 NOTE — H&P (Signed)
Primary Care Physician:  Madelyn Brunner, MD Primary Gastroenterologist:  Dr. Vira Agar  Pre-Procedure History & Physical: HPI:  Brad Brady is a 81 y.o. male is here for an endoscopy and colonoscopy.   Past Medical History  Diagnosis Date  . Hypertension   . Thyroid disease   . High cholesterol   . BPH (benign prostatic hyperplasia)   . GERD (gastroesophageal reflux disease)   . Coronary artery disease   . IBS (irritable bowel syndrome)   . Anemia   . Vitamin D deficiency   . 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 Hernia repair    . Colonoscopy    . Ptca of pda      Prior to Admission medications   Medication Sig Start Date End Date Taking? Authorizing Provider  acetaminophen (TYLENOL) 500 MG tablet Take 1,000 mg by mouth 3 (three) times daily.   Yes Historical Provider, MD  aspirin EC 81 MG tablet Take 81 mg by mouth every other day.   Yes Historical Provider, MD  atorvastatin (LIPITOR) 20 MG tablet Take 20 mg by mouth at bedtime. 06/03/14  Yes Historical Provider, MD  Cholecalciferol (VITAMIN D3) 2000 UNITS capsule Take 2,000 Units by mouth every morning.   Yes Historical Provider, MD  dutasteride (AVODART) 0.5 MG capsule Take 0.5 mg by mouth daily. 07/27/14  Yes Historical Provider, MD  esomeprazole (NEXIUM) 40 MG capsule Take 40 mg by mouth daily as needed (for acid reflux.).  06/03/14  Yes Historical Provider, MD  Multiple Vitamins-Minerals (PRESERVISION/LUTEIN PO) Take 1 tablet by mouth daily.   Yes Historical Provider, MD  senna-docusate (SENOKOT-S) 8.6-50 MG per tablet Take 2 tablets by mouth at bedtime as needed for mild constipation. 09/07/14  Yes Vipul Manuella Ghazi, MD  SYNTHROID 100 MCG tablet Take 100 mcg by mouth at bedtime. 06/03/14  Yes Historical Provider, MD  tamsulosin (FLOMAX) 0.4 MG CAPS capsule Take 0.4 mg by mouth daily. 07/27/14  Yes Historical Provider, MD  atenolol (TENORMIN) 25 MG tablet Take 1 tablet  (25 mg total) by mouth daily. 09/07/14   Max Sane, MD    Allergies as of 10/08/2014  . (No Known Allergies)    History reviewed. No pertinent family history.  Social History   Social History  . Marital Status: Married    Spouse Name: N/A  . Number of Children: N/A  . Years of Education: N/A   Occupational History  . Not on file.   Social History Main Topics  . Smoking status: Never Smoker   . Smokeless tobacco: Not on file  . Alcohol Use: No  . Drug Use: No  . Sexual Activity: Not on file   Other Topics Concern  . Not on file   Social History Narrative    Review of Systems: See HPI, otherwise negative ROS  Physical Exam: BP 137/83 mmHg  Pulse 74  Temp(Src) 97.6 F (36.4 C) (Tympanic)  Resp 16  Ht 5\' 11"  (1.803 m)  Wt 77.111 kg (170 lb)  BMI 23.72 kg/m2  SpO2 100% General:   Alert,  pleasant and cooperative in NAD Head:  Normocephalic and atraumatic. Neck:  Supple; no masses or thyromegaly. Lungs:  Clear throughout to auscultation.    Heart:  Regular rate and rhythm. Abdomen:  Soft, nontender and nondistended. Normal bowel sounds, without guarding, and without rebound.   Neurologic:  Alert and  oriented x4;  grossly normal neurologically.  Impression/Plan:  [name] is here for an endoscopy and colonoscopy to be performed for heme positive stool, anemia, constipation and chronic epigastric pain.  Risks, benefits, limitations, and alternatives regarding  endoscopy and colonoscopy have been reviewed with the patient.  Questions have been answered.  All parties agreeable.   Gaylyn Cheers, MD  10/20/2014, 10:57 AM

## 2014-10-20 NOTE — Transfer of Care (Signed)
Immediate Anesthesia Transfer of Care Note  Patient: Brad Brady  Procedure(s) Performed: Procedure(s): ESOPHAGOGASTRODUODENOSCOPY (EGD) WITH PROPOFOL (N/A) COLONOSCOPY WITH PROPOFOL (N/A)  Patient Location: PACU  Anesthesia Type:General  Level of Consciousness: awake and oriented  Airway & Oxygen Therapy: Patient Spontanous Breathing and Patient connected to nasal cannula oxygen  Post-op Assessment: Report given to RN and Post -op Vital signs reviewed and stable  Post vital signs: Reviewed and stable  Last Vitals:  Filed Vitals:   10/20/14 1020  BP: 137/83  Pulse: 74  Temp: 36.4 C  Resp: 16    Complications: No apparent anesthesia complications

## 2014-10-20 NOTE — Anesthesia Postprocedure Evaluation (Signed)
  Anesthesia Post-op Note  Patient: Brad Brady  Procedure(s) Performed: Procedure(s): ESOPHAGOGASTRODUODENOSCOPY (EGD) WITH PROPOFOL (N/A) COLONOSCOPY WITH PROPOFOL (N/A)  Anesthesia type:General  Patient location: PACU  Post pain: Pain level controlled  Post assessment: Post-op Vital signs reviewed, Patient's Cardiovascular Status Stable, Respiratory Function Stable, Patent Airway and No signs of Nausea or vomiting  Post vital signs: Reviewed and stable  Last Vitals:  Filed Vitals:   10/20/14 1207  BP: 140/74  Pulse: 68  Temp:   Resp: 16    Level of consciousness: awake, alert  and patient cooperative  Complications: No apparent anesthesia complications

## 2014-10-20 NOTE — Anesthesia Preprocedure Evaluation (Addendum)
Anesthesia Evaluation  Patient identified by MRN, date of birth, ID band Patient awake    Reviewed: Allergy & Precautions, NPO status , Patient's Chart, lab work & pertinent test results, reviewed documented beta blocker date and time   Airway Mallampati: II  TM Distance: >3 FB     Dental  (+) Caps, Dental Advisory Given   Pulmonary neg pulmonary ROS,    Pulmonary exam normal       Cardiovascular hypertension, Pt. on medications and Pt. on home beta blockers + angina with exertion + CAD Normal cardiovascular exam    Neuro/Psych negative neurological ROS  negative psych ROS   GI/Hepatic GERD-  Medicated and Controlled,IBS   Endo/Other  Hypothyroidism   Renal/GU   negative genitourinary   Musculoskeletal Hx of herniated thoracic disc   Abdominal Normal abdominal exam  (+)   Peds negative pediatric ROS (+)  Hematology  (+) anemia ,   Anesthesia Other Findings   Reproductive/Obstetrics                            Anesthesia Physical Anesthesia Plan  ASA: III  Anesthesia Plan: General   Post-op Pain Management:    Induction: Intravenous  Airway Management Planned: Mask  Additional Equipment:   Intra-op Plan:   Post-operative Plan:   Informed Consent: I have reviewed the patients History and Physical, chart, labs and discussed the procedure including the risks, benefits and alternatives for the proposed anesthesia with the patient or authorized representative who has indicated his/her understanding and acceptance.   Dental advisory given  Plan Discussed with: CRNA and Surgeon  Anesthesia Plan Comments:         Anesthesia Quick Evaluation

## 2014-10-21 ENCOUNTER — Encounter: Payer: Self-pay | Admitting: Unknown Physician Specialty

## 2014-10-21 LAB — SURGICAL PATHOLOGY

## 2014-10-21 NOTE — Op Note (Signed)
[facility] Gastroenterology Patient Name: Brad Brady Procedure Date: 10/20/2014 10:54 AM MRN: [id number] Account #: [id number] Date of Birth: 11-Jul-1927 Admit Type: Outpatient Age: 81 Room: [facility] ENDO ROOM 3 Gender: Male Note Status: Finalized Procedure:         Colonoscopy Indications:       Abdominal pain in the right lower quadrant, Unexplained                     iron deficiency anemia Providers:         Manya Silvas, MD Referring MD:      Hewitt Blade. Sarina Ser, MD (Referring MD) Medicines:         Propofol per Anesthesia Complications:     No immediate complications. Procedure:         Pre-Anesthesia Assessment:                    - After reviewing the risks and benefits, the patient was                     deemed in satisfactory condition to undergo the procedure.                    After obtaining informed consent, the colonoscope was                     passed under direct vision. Throughout the procedure, the                     patient's blood pressure, pulse, and oxygen saturations                     were monitored continuously. The Colonoscope was                     introduced through the anus and advanced to the the cecum,                     identified by appendiceal orifice and ileocecal valve. The                     colonoscopy was performed without difficulty. The patient                     tolerated the procedure well. The quality of the bowel                     preparation was good. Findings:      Multiple small and large-mouthed diverticula were found in the sigmoid       colon, in the descending colon, in the transverse colon and in the       ascending colon.      Internal hemorrhoids were found during endoscopy. The hemorrhoids were       small and Grade I (internal hemorrhoids that do not prolapse).      The exam was otherwise without abnormality. Impression:        - Diverticulosis in the sigmoid colon, in the descending              colon, in the transverse colon and in the ascending colon.                    - Internal hemorrhoids.                    -  The examination was otherwise normal.                    - No specimens collected. Recommendation:    - Perform an upper GI endoscopy as previously scheduled. Manya Silvas, MD 10/20/2014 11:19:04 AM This report has been signed electronically. Number of Addenda: 0 Note Initiated On: 10/20/2014 10:54 AM Scope Withdrawal Time: 0 hours 3 minutes 36 seconds  Total Procedure Duration: 0 hours 9 minutes 57 seconds       [facility]

## 2014-10-21 NOTE — Op Note (Signed)
[facility] Gastroenterology Patient Name: Brad Brady Procedure Date: 10/20/2014 10:55 AM MRN: [id number] Account #: [id number] Date of Birth: [birth year] Admit Type: Outpatient Age: 81 Room: [facility] ENDO ROOM 3 Gender: Male Note Status: Finalized Procedure:         Upper GI endoscopy Indications:       Unexplained iron deficiency anemia, Heme positive stool Providers:         Manya Silvas, MD Medicines:         Propofol per Anesthesia Complications:     No immediate complications. Procedure:         Pre-Anesthesia Assessment:                    - After reviewing the risks and benefits, the patient was                     deemed in satisfactory condition to undergo the procedure.                    After obtaining informed consent, the endoscope was passed                     under direct vision. Throughout the procedure, the                     patient's blood pressure, pulse, and oxygen saturations                     were monitored continuously. The Endoscope was introduced                     through the mouth, and advanced to the second part of                     duodenum. The upper GI endoscopy was accomplished without                     difficulty. The patient tolerated the procedure well. Findings:      The examined esophagus was normal. GEJ 40cm.      A small hiatus hernia was present.      Patchy minimal inflammation characterized by erythema was found in the       gastric body and in the gastric antrum. Biopsies were taken with a cold       forceps for histology.      The examined duodenum was normal. Impression:        - Normal esophagus.                    - Small hiatus hernia.                    - Gastritis. Biopsied.                    - Normal examined duodenum. Recommendation:    - Await pathology results. Manya Silvas, MD 10/20/2014 11:32:52 AM This report has been signed electronically. Number of Addenda: 0 Note Initiated On:  10/20/2014 10:55 AM      [facility]

## 2014-11-10 DIAGNOSIS — K2930 Chronic superficial gastritis without bleeding: Secondary | ICD-10-CM | POA: Insufficient documentation

## 2015-11-13 ENCOUNTER — Emergency Department: Payer: Medicare Other

## 2015-11-13 ENCOUNTER — Encounter: Payer: Self-pay | Admitting: Emergency Medicine

## 2015-11-13 ENCOUNTER — Emergency Department
Admission: EM | Admit: 2015-11-13 | Discharge: 2015-11-13 | Disposition: A | Discharge status: Home / Self Care | Payer: Medicare Other | Attending: Emergency Medicine | Admitting: Emergency Medicine

## 2015-11-13 DIAGNOSIS — R0602 Shortness of breath: Secondary | ICD-10-CM | POA: Insufficient documentation

## 2015-11-13 DIAGNOSIS — Z7982 Long term (current) use of aspirin: Secondary | ICD-10-CM | POA: Diagnosis not present

## 2015-11-13 DIAGNOSIS — R531 Weakness: Secondary | ICD-10-CM | POA: Diagnosis not present

## 2015-11-13 DIAGNOSIS — I10 Essential (primary) hypertension: Secondary | ICD-10-CM | POA: Insufficient documentation

## 2015-11-13 DIAGNOSIS — Z79899 Other long term (current) drug therapy: Secondary | ICD-10-CM | POA: Insufficient documentation

## 2015-11-13 DIAGNOSIS — R072 Precordial pain: Secondary | ICD-10-CM | POA: Diagnosis not present

## 2015-11-13 DIAGNOSIS — Z791 Long term (current) use of non-steroidal anti-inflammatories (NSAID): Secondary | ICD-10-CM | POA: Diagnosis not present

## 2015-11-13 DIAGNOSIS — R079 Chest pain, unspecified: Secondary | ICD-10-CM

## 2015-11-13 DIAGNOSIS — R0789 Other chest pain: Secondary | ICD-10-CM | POA: Diagnosis present

## 2015-11-13 DIAGNOSIS — I2510 Atherosclerotic heart disease of native coronary artery without angina pectoris: Secondary | ICD-10-CM | POA: Diagnosis not present

## 2015-11-13 LAB — CBC
HEMATOCRIT: 37.5 % — AB (ref 40.0–52.0)
Hemoglobin: 13.1 g/dL (ref 13.0–18.0)
MCH: 33.8 pg (ref 26.0–34.0)
MCHC: 34.9 g/dL (ref 32.0–36.0)
MCV: 96.9 fL (ref 80.0–100.0)
Platelets: 195 10*3/uL (ref 150–440)
RBC: 3.87 MIL/uL — ABNORMAL LOW (ref 4.40–5.90)
RDW: 14.9 % — AB (ref 11.5–14.5)
WBC: 7.2 10*3/uL (ref 3.8–10.6)

## 2015-11-13 LAB — BASIC METABOLIC PANEL
Anion gap: 5 (ref 5–15)
BUN: 13 mg/dL (ref 6–20)
CALCIUM: 9.5 mg/dL (ref 8.9–10.3)
CO2: 29 mmol/L (ref 22–32)
Chloride: 99 mmol/L — ABNORMAL LOW (ref 101–111)
Creatinine, Ser: 0.95 mg/dL (ref 0.61–1.24)
GFR calc Af Amer: >60 mL/min (ref >60)
GLUCOSE: 98 mg/dL (ref 65–99)
POTASSIUM: 4.7 mmol/L (ref 3.5–5.1)
Sodium: 133 mmol/L — ABNORMAL LOW (ref 135–145)

## 2015-11-13 LAB — TROPONIN I: Troponin I: <0.03 ng/mL (ref <0.03)

## 2015-11-13 NOTE — ED Notes (Signed)
Once pt. Was brought to ED room pt. Was asking to leave.  Pt. States his symptoms have passed.

## 2015-11-13 NOTE — ED Notes (Signed)
Pt. Going home with family. 

## 2015-11-13 NOTE — ED Triage Notes (Signed)
Pt to ED from home c/o chest pain that started around 1500 this afternoon.  States pain started after doing non-strenuous work today to central chest at heavy pressure.  States hx of indigestion and feels similar to past flare-ups, took rollaid at home without relief.  Reports dyspnea upon exertion and weakness.  Pt presents A&Ox4, chest rise even and unlabored, speaking in complete and coherent sentences.

## 2015-11-13 NOTE — ED Notes (Addendum)
Pt. States chest pressure that started yesterday around 1500.  Pt. Pointed to pain, pt. Pointed to upper epigastric area.  Pt. States he took a rollaid in the evening.  Pt. Denies pain radiates any where else.  Pt. And pt. Wife were concerned that blood pressure was too high.

## 2015-11-13 NOTE — ED Provider Notes (Signed)
Time Seen: Approximately 0337  I have reviewed the triage notes  Chief Complaint: Chest Pain   History of Present Illness: Brad Brady is a 82 y.o. male *who presents with some chest discomfort that started at 3 PM the previous afternoon. Patient states that he's had similar pain before he states it usually turns out to be indigestion. He states some mild shortness of breath with exertion and some generalized weakness without any current chest discomfort. He denies any back, arm, or jaw pain. He denies any abdominal pain.   Past Medical History:  Diagnosis Date  . Anemia   . BPH (benign prostatic hyperplasia)   . Coronary artery disease   . GERD (gastroesophageal reflux disease)   . 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 High cholesterol   . Hypertension   . IBS (irritable bowel syndrome)   . Thyroid disease   . Vitamin D deficiency     Patient Active Problem List   Diagnosis Date Noted  . Unstable angina ([facility]) 09/05/2014  . ASCVD (arteriosclerotic cardiovascular disease) 09/05/2014  . Abnormal EKG 09/05/2014    Past Surgical History:  Procedure Laterality Date  . CARDIAC CATHETERIZATION    . COLONOSCOPY    . COLONOSCOPY WITH PROPOFOL N/A 10/20/2014   Procedure: COLONOSCOPY WITH PROPOFOL;  Surgeon: Manya Silvas, MD;  Location: [facility] ENDOSCOPY;  Service: Endoscopy;  Laterality: N/A;  . ESOPHAGOGASTRODUODENOSCOPY (EGD) WITH PROPOFOL N/A 10/20/2014   Procedure: ESOPHAGOGASTRODUODENOSCOPY (EGD) WITH PROPOFOL;  Surgeon: Manya Silvas, MD;  Location: [facility] ENDOSCOPY;  Service: Endoscopy;  Laterality: N/A;  . HERNIA REPAIR    . PTCA of PDA      Past Surgical History:  Procedure Laterality Date  . CARDIAC CATHETERIZATION    . COLONOSCOPY    . COLONOSCOPY WITH PROPOFOL N/A 10/20/2014   Procedure: COLONOSCOPY WITH PROPOFOL;  Surgeon: Manya Silvas, MD;  Location: [facility] ENDOSCOPY;  Service: Endoscopy;  Laterality: N/A;  . ESOPHAGOGASTRODUODENOSCOPY (EGD) WITH  PROPOFOL N/A 10/20/2014   Procedure: ESOPHAGOGASTRODUODENOSCOPY (EGD) WITH PROPOFOL;  Surgeon: Manya Silvas, MD;  Location: [facility] ENDOSCOPY;  Service: Endoscopy;  Laterality: N/A;  . HERNIA REPAIR    . PTCA of PDA      Current Outpatient Rx  . Order #: [id number] Class: Historical Med  . Order #: [id number] Class: Historical Med  . Order #: [id number] Class: Normal  . Order #: [id number] Class: Historical Med  . Order #: [id number] Class: Historical Med  . Order #: [id number] Class: Historical Med  . Order #: [id number] Class: Historical Med  . Order #: [id number] Class: Historical Med  . Order #: [id number] Class: Normal  . Order #: [id number] Class: Historical Med  . Order #: [id number] Class: Historical Med    Allergies:  Review of patient's allergies indicates no known allergies.  Family History: History reviewed. No pertinent family history.  Social History: Social History  Substance Use Topics  . Smoking status: Never Smoker  . Smokeless tobacco: Never Used  . Alcohol use No     Review of Systems:   10 point review of systems was performed and was otherwise negative:  Constitutional: No fever Eyes: No visual disturbances ENT: No sore throat, ear pain Cardiac:No current chest pain describes previous substernal chest discomfort without radiation. Describes it as a burning pain similar to his previous reflux Respiratory: No shortness of breath, wheezing, or stridor Abdomen: No abdominal pain, no vomiting, No diarrhea Endocrine: No weight loss, No night sweats Extremities: No peripheral edema, cyanosis Skin: No rashes, easy bruising Neurologic: No focal  weakness, trouble with speech or swollowing Urologic: No dysuria, Hematuria, or urinary frequency   Physical Exam:  ED Triage Vitals  Enc Vitals Group     BP 11/13/15 0135 (!) 159/76     Pulse Rate 11/13/15 0135 62     Resp 11/13/15 0135 18     Temp 11/13/15 0135 97.8 F (36.6 C)     Temp Source 11/13/15 0135 Oral     SpO2  11/13/15 0135 100 %     Weight 11/13/15 0137 172 lb (78 kg)     Height 11/13/15 0137 5\' 11"  (1.803 m)     Head Circumference --      Peak Flow --      Pain Score 11/13/15 0137 6     Pain Loc --      Pain Edu? --      Excl. in [place]? --     General: Awake , Alert , and Oriented times 3; GCS 15 Head: Normal cephalic , atraumatic Eyes: Pupils equal , round, reactive to light Nose/Throat: No nasal drainage, patent upper airway without erythema or exudate.  Neck: Supple, Full range of motion, No anterior adenopathy or palpable thyroid masses Lungs: Clear to ascultation without wheezes , rhonchi, or rales Heart: Regular rate, regular rhythm without murmurs , gallops , or rubs Abdomen: Soft, non tender without rebound, guarding , or rigidity; bowel sounds positive and symmetric in all 4 quadrants. No organomegaly .        Extremities: 2 plus symmetric pulses. No edema, clubbing or cyanosis Neurologic: normal ambulation, Motor symmetric without deficits, sensory intact Skin: warm, dry, no rashes   Labs:   All laboratory work was reviewed including any pertinent negatives or positives listed below:  Labs Reviewed  BASIC METABOLIC PANEL - Abnormal; Notable for the following:       Result Value   Sodium 133 (*)    Chloride 99 (*)    All other components within normal limits  CBC - Abnormal; Notable for the following:    RBC 3.87 (*)    HCT 37.5 (*)    RDW 14.9 (*)    All other components within normal limits  TROPONIN I  TROPONIN I    EKG:  ED ECG REPORT I, Daymon Larsen, the attending physician, personally viewed and interpreted this ECG.  Date: 11/13/2015 EKG Time: 0134 Rate: 61 Rhythm: normal sinus rhythm with occasional PVCs QRS Axis: Right bundle-branch block Intervals: normal ST/T Wave abnormalities: normal Conduction Disturbances: none Narrative Interpretation: unremarkable    Radiology: * "Dg Chest 2 View  Result Date: 11/13/2015 CLINICAL DATA:  Acute onset of  central chest pressure and dyspnea on exertion. Generalized weakness. Initial encounter. EXAM: CHEST  2 VIEW COMPARISON:  Chest radiograph performed 09/05/2014 FINDINGS: The lungs are well-aerated and clear. There is no evidence of focal opacification, pleural effusion or pneumothorax. The heart is normal in size; the mediastinal contour is within normal limits. No acute osseous abnormalities are seen. IMPRESSION: No acute cardiopulmonary process seen. Electronically Signed   By: Garald Balding M.D.   On: 11/13/2015 02:43  "    I personally reviewed the radiologic studies     ED Course:  Differential includes all life-threatening causes for chest pain. This includes but is not exclusive to acute coronary syndrome, aortic dissection, pulmonary embolism, cardiac tamponade, community-acquired pneumonia, pericarditis, musculoskeletal chest wall pain, etc.  Patient's stay here was uneventful and I felt this was unlikely to be ischemic in nature given  his past history etc. Patient states he's been having some constipation and normally will get some reflux and seemed to be reassured that he had 2 separate troponins which were negative. Patient's had previous several hours of discomfort. The patient was given instructions on his constipation and was referred back to his primary physician. He was discharged home his family is also present when discussed discharge instructions. Clinical Course     Assessment:  Acute unspecified chest pain  Final Clinical Impression:   Final diagnoses:  Nonspecific chest pain     Plan:  Outpatient Patient was advised to return immediately if condition worsens. Patient was advised to follow up with their primary care physician or other specialized physicians involved in their outpatient care. The patient and/or family member/power of attorney had laboratory results reviewed at the bedside. All questions and concerns were addressed and appropriate discharge  instructions were distributed by the nursing staff.            Daymon Larsen, MD 11/13/15 [phone]

## 2015-11-25 MED ORDER — LORAZEPAM 1 MG PO TABS
ORAL_TABLET | ORAL | Status: AC
  Filled 2015-11-25: qty 1

## 2016-10-01 ENCOUNTER — Other Ambulatory Visit (INDEPENDENT_AMBULATORY_CARE_PROVIDER_SITE_OTHER): Payer: Self-pay | Admitting: Vascular Surgery

## 2016-10-01 DIAGNOSIS — I723 Aneurysm of iliac artery: Secondary | ICD-10-CM

## 2016-10-01 DIAGNOSIS — I77811 Abdominal aortic ectasia: Secondary | ICD-10-CM

## 2016-10-02 ENCOUNTER — Ambulatory Visit (INDEPENDENT_AMBULATORY_CARE_PROVIDER_SITE_OTHER): Payer: Medicare Other | Admitting: Vascular Surgery

## 2016-10-02 ENCOUNTER — Ambulatory Visit (INDEPENDENT_AMBULATORY_CARE_PROVIDER_SITE_OTHER): Payer: Medicare Other

## 2016-10-02 ENCOUNTER — Encounter (INDEPENDENT_AMBULATORY_CARE_PROVIDER_SITE_OTHER): Payer: Self-pay | Admitting: Vascular Surgery

## 2016-10-02 VITALS — BP 129/69 | HR 53 | Resp 15 | Ht 72.0 in | Wt 177.0 lb

## 2016-10-02 DIAGNOSIS — I77819 Aortic ectasia, unspecified site: Secondary | ICD-10-CM | POA: Diagnosis not present

## 2016-10-02 DIAGNOSIS — I723 Aneurysm of iliac artery: Secondary | ICD-10-CM

## 2016-10-02 DIAGNOSIS — I77811 Abdominal aortic ectasia: Secondary | ICD-10-CM

## 2016-10-02 DIAGNOSIS — I10 Essential (primary) hypertension: Secondary | ICD-10-CM | POA: Diagnosis not present

## 2016-10-02 DIAGNOSIS — E7800 Pure hypercholesterolemia, unspecified: Secondary | ICD-10-CM

## 2016-10-02 NOTE — Progress Notes (Signed)
MRN : [id number]  Brad Brady is a 83 y.o. (05/14/1927) male who presents with chief complaint of  Chief Complaint  Patient presents with  . AAA    2 year Carotid follow up  .  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 He is doing well without any aneurysm related symptoms. Specifically, the patient denies new back or abdominal pain, or signs of peripheral embolization. He is in his usual state of health today. His maximal aortic diameter on duplex today is 2.9 cm. His right common iliac artery measures 1.6 cm and his left common iliac artery measures 1.5 cm in maximal diameter. This is a slight increase from his study 2 years ago.  Current Outpatient Prescriptions  Medication Sig Dispense Refill  . acetaminophen (TYLENOL) 500 MG tablet Take 1,000 mg by mouth 3 (three) times daily.    [name] Kitchen aspirin EC 81 MG tablet Take 81 mg by mouth every other day.    [name] Kitchen atenolol (TENORMIN) 25 MG tablet Take 1 tablet (25 mg total) by mouth daily. 30 tablet 0  . atorvastatin (LIPITOR) 20 MG tablet Take 20 mg by mouth at bedtime.    . Cholecalciferol (VITAMIN D3) 2000 UNITS capsule Take 2,000 Units by mouth every morning.    . clobetasol (TEMOVATE) 0.05 % external solution Reported on 06/29/2015    . dutasteride (AVODART) 0.5 MG capsule Take 0.5 mg by mouth daily.    [name] Kitchen esomeprazole (NEXIUM) 40 MG capsule Take 40 mg by mouth daily as needed (for acid reflux.).     [name] Kitchen Multiple Vitamins-Minerals (PRESERVISION/LUTEIN PO) Take 1 tablet by mouth daily.    [name] Kitchen senna-docusate (SENOKOT-S) 8.6-50 MG per tablet Take 2 tablets by mouth at bedtime as needed for mild constipation. 30 tablet 0  . SYNTHROID 100 MCG tablet Take 100 mcg by mouth at bedtime.    . tamsulosin (FLOMAX) 0.4 MG CAPS capsule Take 0.4 mg by mouth daily.    . vitamin B-12 (CYANOCOBALAMIN) 1000 MCG tablet Take by mouth.     No current facility-administered medications for this visit.       Past Medical History:  Diagnosis Date  . Anemia   . BPH (benign prostatic hyperplasia)   . Coronary artery disease   . GERD (gastroesophageal reflux disease)   . 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 High cholesterol   . Hypertension   . IBS (irritable bowel syndrome)   . Thyroid disease   . Vitamin D deficiency     Past Surgical History:  Procedure Laterality Date  . CARDIAC CATHETERIZATION    . COLONOSCOPY    . COLONOSCOPY WITH PROPOFOL N/A 10/20/2014   Procedure: COLONOSCOPY WITH PROPOFOL;  Surgeon: Manya Silvas, MD;  Location[facility] ENDOSCOPY;  Service: Endoscopy;  Laterality: N/A;  . ESOPHAGOGASTRODUODENOSCOPY (EGD) WITH PROPOFOL N/A 10/20/2014   Procedure: ESOPHAGOGASTRODUODENOSCOPY (EGD) WITH PROPOFOL;  Surgeon: Manya Silvas, MD;  Location[facility] ENDOSCOPY;  Service: Endoscopy;  Laterality: N/A;  . HERNIA REPAIR    . PTCA of PDA      Social History Social History  Substance Use Topics  . Smoking status: Never Smoker  . Smokeless tobacco: Never Used  . Alcohol use No    Family History No bleeding or clotting disorders  No Known Allergies   REVIEW OF SYSTEMS (Negative unless checked)  Constitutional: [] Weight loss  [] Fever  [] Chills Cardiac: [] Chest pain   [] Chest pressure   []   Palpitations   [] Shortness of breath when laying flat   [] Shortness of breath at rest   [] Shortness of breath with exertion. Vascular:  [] Pain in legs with walking   [] Pain in legs at rest   [] Pain in legs when laying flat   [] Claudication   [] Pain in feet when walking  [] Pain in feet at rest  [] Pain in feet when laying flat   [] History of DVT   [] Phlebitis   [] Swelling in legs   [] Varicose veins   [] Non-healing ulcers Pulmonary:   [] Uses home oxygen   [] Productive cough   [] Hemoptysis   [] Wheeze  [] COPD   [] Asthma Neurologic:  [] Dizziness  [] Blackouts   [] Seizures   [] History of stroke   [] History of TIA  [] Aphasia   [] Temporary blindness   [] Dysphagia   [] Weakness or  numbness in arms   [] Weakness or numbness in legs Musculoskeletal:  [] Arthritis   [] Joint swelling   [] Joint pain   [] Low back pain Hematologic:  [] Easy bruising  [] Easy bleeding   [] Hypercoagulable state   [] Anemic   Gastrointestinal:  [] Blood in stool   [] Vomiting blood  [] Gastroesophageal reflux/heartburn   [] Abdominal pain Genitourinary:  [] Chronic kidney disease   [] Difficult urination  [] Frequent urination  [] Burning with urination   [] Hematuria Skin:  [] Rashes   [] Ulcers   [] Wounds Psychological:  [] History of anxiety   []  History of major depression.  Physical Examination  BP 129/69 (BP Location: Right Arm)   Pulse (!) 53   Resp 15   Ht 6' (1.829 m)   Wt 177 lb (80.3 kg)   BMI 24.01 kg/m  Gen:  WD/WN, NAD Head: [place]/AT, No temporalis wasting. Ear/Nose/Throat: Hearing grossly intact, nares w/o erythema or drainage, trachea midline Eyes: Conjunctiva clear. Sclera non-icteric Neck: Supple.  No JVD.  Pulmonary:  Good air movement, no use of accessory muscles.  Cardiac: RRR, normal S1, S2 Vascular:  Vessel Right Left  Radial Palpable Palpable                                   Gastrointestinal: soft, non-tender/non-distended. Aorta not palpable Musculoskeletal: M/S 5/5 throughout.  No deformity or atrophy. No edema. Neurologic: Sensation grossly intact in extremities.  Symmetrical.  Speech is fluent.  Psychiatric: Judgment intact, Mood & affect appropriate for pt's clinical situation. Dermatologic: No rashes or ulcers noted.  No cellulitis or open wounds.       Labs No results found for this or any previous visit (from the past 2160 hour(s)).  Radiology No results found.    Assessment/Plan  Hypertension blood pressure control important in reducing the progression of atherosclerotic disease and aneurysmal degeneration. On appropriate oral medications.   High cholesterol lipid control important in reducing the progression of atherosclerotic disease.  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 His right common iliac artery measures 1.6 cm and his left common iliac artery measures 1.5 cm in maximal diameter. This is a slight increase from his study 2 years ago.   Iliac artery aneurysm ([facility]) His maximal aortic diameter on duplex today is 2.9 cm. His right common iliac artery measures 1.6 cm and his left common iliac artery measures 1.5 cm in maximal diameter. This is a slight increase from his study 2 years ago. We will continue to follow this every other year with duplex. He will contact our office with any  problems in the interim    Leotis Pain, MD  10/02/2016 9:10 AM    This note was created with Dragon medical transcription system.  Any errors from dictation are purely unintentional

## 2016-10-02 NOTE — Assessment & Plan Note (Signed)
His maximal aortic diameter on duplex today is 2.9 cm. His right common iliac artery measures 1.6 cm and his left common iliac artery measures 1.5 cm in maximal diameter. This is a slight increase from his study 2 years ago. We will continue to follow this every other year with duplex. He will contact our office with any problems in the interim

## 2016-10-02 NOTE — Assessment & Plan Note (Signed)
blood pressure control important in reducing the progression of atherosclerotic disease and aneurysmal degeneration. On appropriate oral medications.  

## 2016-10-02 NOTE — Assessment & Plan Note (Signed)
lipid control important in reducing the progression of atherosclerotic disease. Continue statin therapy  

## 2016-10-02 NOTE — Assessment & Plan Note (Signed)
His maximal aortic diameter on duplex today is 2.9 cm. His right common iliac artery measures 1.6 cm and his left common iliac artery measures 1.5 cm in maximal diameter. This is a slight increase from his study 2 years ago.

## 2017-06-21 ENCOUNTER — Observation Stay
Admission: EM | Admit: 2017-06-21 | Discharge: 2017-06-22 | Disposition: A | Discharge status: Home / Self Care | Payer: Medicare Other | Attending: Family Medicine | Admitting: Family Medicine

## 2017-06-21 ENCOUNTER — Encounter: Payer: Self-pay | Admitting: Emergency Medicine

## 2017-06-21 ENCOUNTER — Emergency Department: Payer: Medicare Other

## 2017-06-21 ENCOUNTER — Other Ambulatory Visit: Payer: Self-pay

## 2017-06-21 DIAGNOSIS — R0789 Other chest pain: Principal | ICD-10-CM | POA: Insufficient documentation

## 2017-06-21 DIAGNOSIS — Z79899 Other long term (current) drug therapy: Secondary | ICD-10-CM | POA: Diagnosis not present

## 2017-06-21 DIAGNOSIS — R079 Chest pain, unspecified: Secondary | ICD-10-CM | POA: Diagnosis present

## 2017-06-21 DIAGNOSIS — I10 Essential (primary) hypertension: Secondary | ICD-10-CM | POA: Diagnosis not present

## 2017-06-21 DIAGNOSIS — N400 Enlarged prostate without lower urinary tract symptoms: Secondary | ICD-10-CM | POA: Diagnosis not present

## 2017-06-21 DIAGNOSIS — I4510 Unspecified right bundle-branch block: Secondary | ICD-10-CM | POA: Insufficient documentation

## 2017-06-21 DIAGNOSIS — K219 Gastro-esophageal reflux disease without esophagitis: Secondary | ICD-10-CM | POA: Diagnosis not present

## 2017-06-21 DIAGNOSIS — R0602 Shortness of breath: Secondary | ICD-10-CM | POA: Insufficient documentation

## 2017-06-21 DIAGNOSIS — E039 Hypothyroidism, unspecified: Secondary | ICD-10-CM | POA: Insufficient documentation

## 2017-06-21 DIAGNOSIS — I2510 Atherosclerotic heart disease of native coronary artery without angina pectoris: Secondary | ICD-10-CM | POA: Diagnosis not present

## 2017-06-21 DIAGNOSIS — Z7982 Long term (current) use of aspirin: Secondary | ICD-10-CM | POA: Insufficient documentation

## 2017-06-21 DIAGNOSIS — I700 Atherosclerosis of aorta: Secondary | ICD-10-CM | POA: Insufficient documentation

## 2017-06-21 DIAGNOSIS — E7800 Pure hypercholesterolemia, unspecified: Secondary | ICD-10-CM | POA: Insufficient documentation

## 2017-06-21 DIAGNOSIS — K589 Irritable bowel syndrome without diarrhea: Secondary | ICD-10-CM | POA: Insufficient documentation

## 2017-06-21 DIAGNOSIS — E559 Vitamin D deficiency, unspecified: Secondary | ICD-10-CM | POA: Insufficient documentation

## 2017-06-21 LAB — BASIC METABOLIC PANEL
ANION GAP: 9 (ref 5–15)
BUN: 11 mg/dL (ref 6–20)
CHLORIDE: 103 mmol/L (ref 101–111)
CO2: 25 mmol/L (ref 22–32)
Calcium: 9.1 mg/dL (ref 8.9–10.3)
Creatinine, Ser: 0.81 mg/dL (ref 0.61–1.24)
GFR calc Af Amer: >60 mL/min (ref >60)
Glucose, Bld: 95 mg/dL (ref 65–99)
POTASSIUM: 3.5 mmol/L (ref 3.5–5.1)
SODIUM: 137 mmol/L (ref 135–145)

## 2017-06-21 LAB — TROPONIN I: Troponin I: <0.03 ng/mL (ref <0.03)

## 2017-06-21 LAB — CBC
HEMATOCRIT: 37.9 % — AB (ref 40.0–52.0)
HEMOGLOBIN: 13.5 g/dL (ref 13.0–18.0)
MCH: 34.5 pg — ABNORMAL HIGH (ref 26.0–34.0)
MCHC: 35.6 g/dL (ref 32.0–36.0)
MCV: 97.1 fL (ref 80.0–100.0)
Platelets: 164 10*3/uL (ref 150–440)
RBC: 3.9 MIL/uL — ABNORMAL LOW (ref 4.40–5.90)
RDW: 14.3 % (ref 11.5–14.5)
WBC: 5.3 10*3/uL (ref 3.8–10.6)

## 2017-06-21 MED ORDER — LEVOTHYROXINE SODIUM 112 MCG PO TABS
112.0000 ug | ORAL_TABLET | Freq: Every day | ORAL | Status: DC
  Administered 2017-06-22: 112 ug via ORAL
  Filled 2017-06-21: qty 1

## 2017-06-21 MED ORDER — ATENOLOL 25 MG PO TABS
25.0000 mg | ORAL_TABLET | Freq: Every day | ORAL | Status: DC
  Administered 2017-06-22: 25 mg via ORAL
  Filled 2017-06-21: qty 1

## 2017-06-21 MED ORDER — TAMSULOSIN HCL 0.4 MG PO CAPS
0.4000 mg | ORAL_CAPSULE | Freq: Every day | ORAL | Status: DC
  Administered 2017-06-22: 0.4 mg via ORAL
  Filled 2017-06-21: qty 1

## 2017-06-21 MED ORDER — ACETAMINOPHEN 325 MG PO TABS: 650.0000 mg | ORAL_TABLET | Freq: Four times a day (QID) | ORAL | Status: DC | PRN

## 2017-06-21 MED ORDER — NITROGLYCERIN 2 % TD OINT
0.5000 [in_us] | TOPICAL_OINTMENT | TRANSDERMAL | Status: DC
  Filled 2017-06-21: qty 1

## 2017-06-21 MED ORDER — HYDROCODONE-ACETAMINOPHEN 5-325 MG PO TABS: 1.0000 | ORAL_TABLET | ORAL | Status: DC | PRN

## 2017-06-21 MED ORDER — SODIUM CHLORIDE 0.9 % IV SOLN: 250.0000 mL | INTRAVENOUS | Status: DC | PRN

## 2017-06-21 MED ORDER — OCUVITE-LUTEIN PO CAPS
ORAL_CAPSULE | Freq: Every day | ORAL | Status: DC
  Administered 2017-06-22: 1 via ORAL
  Filled 2017-06-21: qty 1

## 2017-06-21 MED ORDER — ATORVASTATIN CALCIUM 20 MG PO TABS
20.0000 mg | ORAL_TABLET | Freq: Every day | ORAL | Status: DC
  Administered 2017-06-21: 20 mg via ORAL
  Filled 2017-06-21: qty 1

## 2017-06-21 MED ORDER — SENNOSIDES-DOCUSATE SODIUM 8.6-50 MG PO TABS: 2.0000 | ORAL_TABLET | Freq: Every evening | ORAL | Status: DC | PRN

## 2017-06-21 MED ORDER — VITAMIN B-12 1000 MCG PO TABS
1000.0000 ug | ORAL_TABLET | Freq: Every day | ORAL | Status: DC
Start: 2017-06-22 — End: 2017-06-22
  Administered 2017-06-22: 1000 ug via ORAL
  Filled 2017-06-21: qty 1

## 2017-06-21 MED ORDER — PANTOPRAZOLE SODIUM 40 MG PO TBEC
40.0000 mg | DELAYED_RELEASE_TABLET | Freq: Every day | ORAL | Status: DC
  Administered 2017-06-22: 40 mg via ORAL
  Filled 2017-06-21: qty 1

## 2017-06-21 MED ORDER — ASPIRIN 81 MG PO CHEW
162.0000 mg | CHEWABLE_TABLET | Freq: Once | ORAL | Status: DC
  Filled 2017-06-21: qty 2

## 2017-06-21 MED ORDER — ENOXAPARIN SODIUM 40 MG/0.4ML ~~LOC~~ SOLN
40.0000 mg | SUBCUTANEOUS | Status: DC
  Administered 2017-06-21: 40 mg via SUBCUTANEOUS
  Filled 2017-06-21: qty 0.4

## 2017-06-21 MED ORDER — LEVOTHYROXINE SODIUM 112 MCG PO TABS
112.0000 ug | ORAL_TABLET | Freq: Every day | ORAL | Status: DC
  Filled 2017-06-21: qty 1

## 2017-06-21 MED ORDER — ONDANSETRON HCL 4 MG/2ML IJ SOLN: 4.0000 mg | Freq: Four times a day (QID) | INTRAMUSCULAR | Status: DC | PRN

## 2017-06-21 MED ORDER — SODIUM CHLORIDE 0.9% FLUSH
3.0000 mL | Freq: Two times a day (BID) | INTRAVENOUS | Status: DC
  Administered 2017-06-21 – 2017-06-22 (×2): 3 mL via INTRAVENOUS

## 2017-06-21 MED ORDER — ASPIRIN EC 81 MG PO TBEC
81.0000 mg | DELAYED_RELEASE_TABLET | ORAL | Status: DC
  Administered 2017-06-22: 81 mg via ORAL
  Filled 2017-06-21: qty 1

## 2017-06-21 MED ORDER — ONDANSETRON HCL 4 MG PO TABS: 4.0000 mg | ORAL_TABLET | Freq: Four times a day (QID) | ORAL | Status: DC | PRN

## 2017-06-21 MED ORDER — VITAMIN D 1000 UNITS PO TABS
2000.0000 [IU] | ORAL_TABLET | Freq: Every morning | ORAL | Status: DC
  Administered 2017-06-22: 2000 [IU] via ORAL

## 2017-06-21 MED ORDER — ACETAMINOPHEN 500 MG PO TABS: 500.0000 mg | ORAL_TABLET | Freq: Four times a day (QID) | ORAL | Status: DC | PRN

## 2017-06-21 MED ORDER — ASPIRIN 81 MG PO CHEW
162.0000 mg | CHEWABLE_TABLET | Freq: Once | ORAL | Status: AC
  Administered 2017-06-21: 162 mg via ORAL
  Filled 2017-06-21: qty 2

## 2017-06-21 MED ORDER — DUTASTERIDE 0.5 MG PO CAPS
0.5000 mg | ORAL_CAPSULE | Freq: Every day | ORAL | Status: DC
  Administered 2017-06-22: 0.5 mg via ORAL
  Filled 2017-06-21: qty 1

## 2017-06-21 MED ORDER — SODIUM CHLORIDE 0.9% FLUSH: 3.0000 mL | INTRAVENOUS | Status: DC | PRN

## 2017-06-21 MED ORDER — NITROGLYCERIN 0.4 MG SL SUBL: 0.4000 mg | SUBLINGUAL_TABLET | SUBLINGUAL | Status: DC | PRN

## 2017-06-21 MED ORDER — ACETAMINOPHEN 650 MG RE SUPP: 650.0000 mg | Freq: Four times a day (QID) | RECTAL | Status: DC | PRN

## 2017-06-21 NOTE — Progress Notes (Signed)
The patient is admitted to room 259 with the diagnosis of chest pain . Alert and oriented x 4. Denied any acute pain at this moment. Admission profile completed by Jim Like. Patient oriented to her room, staff and ascom/call bell. Patient voiced no concern.  Safety measures implemented. Will continue to monitor.

## 2017-06-21 NOTE — Progress Notes (Signed)
Advanced care plan.  Purpose of the Encounter: CODE STATUS  Parties in Attendance:patient   Patient's Decision Capacity: intact  Subjective/Patient's story: Patient is a 84 year old with history of coronary artery disease, GERD, hyperlipidemia, hypertension presenting with chest pain being admitted for ACS evaluation   Objective/Medical story I discussed with the patient regarding CODE STATUS including intubation CPR and noninvasive ventilation.  Patient states that he does not want to be intubated but would like everything else done    Goals of care determination: Partial code   CODE STATUS: Partial code   Time spent discussing advanced care planning: 16 minutes

## 2017-06-21 NOTE — ED Notes (Signed)
XR at bedside

## 2017-06-21 NOTE — H&P (Signed)
[facility] NAME: Brad Brady    MR#:  [id number]  DATE OF BIRTH:  12/28/1927  DATE OF ADMISSION:  06/21/2017  PRIMARY CARE PHYSICIAN: Madelyn Brunner, MD   REQUESTING/REFERRING PHYSICIAN: Lenise Arena, MD  CHIEF COMPLAINT:   Chief Complaint  Patient presents with  . Chest Pain  . Shortness of Breath    HISTORY OF PRESENT ILLNESS: Brad Brady  is a 84 y.o. male with a known history of coronary artery disease, BPH, GERD, hyperlipidemia and hypertension who is presenting with complaint of chest pressure.  Patient reports that his symptoms have been going on for the past few months. At times are related to eating.  He started having chest pressure earlier today not related to eating.  And continue to persist.  Patient did not have any shortness of breath or radiation of the pain.  Due to his symptoms being persistent he decided to come to the ER.  In the ER he was noted to have a EKG there is negative cardiac enzymes are negative.  PAST MEDICAL HISTORY:   Past Medical History:  Diagnosis Date  . Anemia   . BPH (benign prostatic hyperplasia)   . Coronary artery disease   . GERD (gastroesophageal reflux disease)   . 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 High cholesterol   . Hypertension   . IBS (irritable bowel syndrome)   . Thyroid disease   . Vitamin D deficiency     PAST SURGICAL HISTORY:  Past Surgical History:  Procedure Laterality Date  . CARDIAC CATHETERIZATION    . COLONOSCOPY    . COLONOSCOPY WITH PROPOFOL N/A 10/20/2014   Procedure: COLONOSCOPY WITH PROPOFOL;  Surgeon: Manya Silvas, MD;  Location: [facility] ENDOSCOPY;  Service: Endoscopy;  Laterality: N/A;  . ESOPHAGOGASTRODUODENOSCOPY (EGD) WITH PROPOFOL N/A 10/20/2014   Procedure: ESOPHAGOGASTRODUODENOSCOPY (EGD) WITH PROPOFOL;  Surgeon: Manya Silvas, MD;  Location: [facility] ENDOSCOPY;  Service: Endoscopy;  Laterality: N/A;  . HERNIA REPAIR    . PTCA  of PDA      SOCIAL HISTORY:  Social History   Tobacco Use  . Smoking status: Never Smoker  . Smokeless tobacco: Never Used  Substance Use Topics  . Alcohol use: No    FAMILY HISTORY: No family history on file.  DRUG ALLERGIES: No Known Allergies  REVIEW OF SYSTEMS:   CONSTITUTIONAL: No fever,  fatigue or weakness.  EYES: No blurred or double vision.  EARS, NOSE, AND THROAT: No tinnitus or ear pain.  RESPIRATORY: No cough, shortness of breath, wheezing or hemoptysis.  CARDIOVASCULAR: Positive chest pain, orthopnea, edema.  GASTROINTESTINAL: No nausea, vomiting, diarrhea or abdominal pain.  GENITOURINARY: No dysuria, hematuria.  ENDOCRINE: No polyuria, nocturia,  HEMATOLOGY: No anemia, easy bruising or bleeding SKIN: No rash or lesion. MUSCULOSKELETAL: No joint pain or arthritis.   NEUROLOGIC: No tingling, numbness, weakness.  PSYCHIATRY: No anxiety or depression.   MEDICATIONS AT HOME:  Prior to Admission medications   Medication Sig Start Date End Date Taking? Authorizing Provider  acetaminophen (TYLENOL) 500 MG tablet Take 1,000 mg by mouth 3 (three) times daily.    [provider]  aspirin EC 81 MG tablet Take 81 mg by mouth every other day.    [provider]  atenolol (TENORMIN) 25 MG tablet Take 1 tablet (25 mg total) by mouth daily. 09/07/14   Max Sane, MD  atorvastatin (LIPITOR) 20 MG tablet Take 20 mg by mouth at  bedtime. 06/03/14   [provider]  Cholecalciferol (VITAMIN D3) 2000 UNITS capsule Take 2,000 Units by mouth every morning.    [provider]  clobetasol (TEMOVATE) 0.05 % external solution Reported on 06/29/2015 05/17/15   [provider]  dutasteride (AVODART) 0.5 MG capsule Take 0.5 mg by mouth daily. 07/27/14   [provider]  esomeprazole (NEXIUM) 40 MG capsule Take 40 mg by mouth daily as needed (for acid reflux.).  06/03/14   [provider]  Multiple Vitamins-Minerals (PRESERVISION/LUTEIN  PO) Take 1 tablet by mouth daily.    [provider]  senna-docusate (SENOKOT-S) 8.6-50 MG per tablet Take 2 tablets by mouth at bedtime as needed for mild constipation. 09/07/14   Max Sane, MD  SYNTHROID 100 MCG tablet Take 100 mcg by mouth at bedtime. 06/03/14   [provider]  tamsulosin (FLOMAX) 0.4 MG CAPS capsule Take 0.4 mg by mouth daily. 07/27/14   [provider]  vitamin B-12 (CYANOCOBALAMIN) 1000 MCG tablet Take by mouth.    [provider]      PHYSICAL EXAMINATION:   VITAL SIGNS: Blood pressure (!) 168/86, pulse 65, temperature 98.2 F (36.8 C), temperature source Oral, resp. rate 17, height 6\' 1"  (1.854 m), weight 80.3 kg (177 lb), SpO2 100 %.  GENERAL:  84 y.o.-year-old patient lying in the bed with no acute distress.  EYES: Pupils equal, round, reactive to light and accommodation. No scleral icterus. Extraocular muscles intact.  HEENT: Head atraumatic, normocephalic. Oropharynx and nasopharynx clear.  NECK:  Supple, no jugular venous distention. No thyroid enlargement, no tenderness.  LUNGS: Normal breath sounds bilaterally, no wheezing, rales,rhonchi or crepitation. No use of accessory muscles of respiration.  CARDIOVASCULAR: S1, S2 normal. No murmurs, rubs, or gallops.  ABDOMEN: Soft, nontender, nondistended. Bowel sounds present. No organomegaly or mass.  EXTREMITIES: No pedal edema, cyanosis, or clubbing.  NEUROLOGIC: Cranial nerves II through XII are intact. Muscle strength 5/5 in all extremities. Sensation intact. Gait not checked.  PSYCHIATRIC: The patient is alert and oriented x 3.  SKIN: No obvious rash, lesion, or ulcer.   LABORATORY PANEL:   CBC Recent Labs  Lab 06/21/17 1851  WBC 5.3  HGB 13.5  HCT 37.9*  PLT 164  MCV 97.1  MCH 34.5*  MCHC 35.6  RDW 14.3   ------------------------------------------------------------------------------------------------------------------  Chemistries  Recent Labs  Lab  06/21/17 1851  NA 137  K 3.5  CL 103  CO2 25  GLUCOSE 95  BUN 11  CREATININE 0.81  CALCIUM 9.1   ------------------------------------------------------------------------------------------------------------------ estimated creatinine clearance is 72.6 mL/min (by C-G formula based on SCr of 0.81 mg/dL). ------------------------------------------------------------------------------------------------------------------ No results for input(s): TSH, T4TOTAL, T3FREE, THYROIDAB in the last 72 hours.  Invalid input(s): FREET3   Coagulation profile No results for input(s): INR, PROTIME in the last 168 hours. ------------------------------------------------------------------------------------------------------------------- No results for input(s): DDIMER in the last 72 hours. -------------------------------------------------------------------------------------------------------------------  Cardiac Enzymes Recent Labs  Lab 06/21/17 1851  TROPONINI <0.03   ------------------------------------------------------------------------------------------------------------------ Invalid input(s): POCBNP  ---------------------------------------------------------------------------------------------------------------  Urinalysis No results found for: COLORURINE, APPEARANCEUR, LABSPEC, PHURINE, GLUCOSEU, HGBUR, BILIRUBINUR, KETONESUR, PROTEINUR, UROBILINOGEN, NITRITE, LEUKOCYTESUR   RADIOLOGY: Dg Chest 2 View  Result Date: 06/21/2017 CLINICAL DATA:  Chest pain EXAM: CHEST - 2 VIEW COMPARISON:  11/13/2015 FINDINGS: Hyperinflation with chronic bronchitic changes. No acute airspace disease or pleural effusion. Stable cardiomediastinal silhouette with aortic atherosclerosis. No pneumothorax. IMPRESSION: No active cardiopulmonary disease. Hyperinflation with mild bronchitic changes. Electronically Signed   By: Madie Reno.D.  On: 06/21/2017 19:24    EKG: Orders placed or performed during the  hospital encounter of 06/21/17  . ED EKG within 10 minutes  . ED EKG within 10 minutes  . EKG 12-Lead  . EKG 12-Lead    IMPRESSION AND PLAN: Patient is 84 year old white male with history of coronary artery disease presenting with chest pressure  1.  Chest pressure we will place in observation check serial cardiac enzymes Continue aspirin Nitroglycerin as needed I will order a stress test for the morning I will ask his cardiologist to come evaluate him  2.  Essential hypertension continue atenolol  3.  Hyperlipidemia continue Lipitor  4.  Hypothyroidism continue Synthroid  5.  BPH continue Flomax  6.  CODE STATUS discussed with the patient he would like a partial code   All the records are reviewed and case discussed with ED provider. Management plans discussed with the patient, family and they are in agreement.  CODE STATUS: Code Status History    Date Active Date Inactive Code Status Order ID Comments User Context   09/05/2014 1257 09/07/2014 1855 Full Code [id number]  Idelle Crouch, MD Inpatient       TOTAL TIME TAKING CARE OF THIS PATIENT:29minutes.    Dustin Flock M.D on 06/21/2017 at 8:10 PM  Between 7am to 6pm - Pager - [phone]  After 6pm go to [email or web address] - password [email or web address]  [facility] Office  [phone]  CC: Primary care physician; Madelyn Brunner, MD

## 2017-06-21 NOTE — ED Triage Notes (Signed)
Pt to ED via POV c/o chest pain that started around 1230-1300. Pt states that he thought it was just heartburn. Pt states that he has started to get dizziness and having some shortness of breath. Pt has cardiac hx. Pt in NAD at this time.

## 2017-06-22 ENCOUNTER — Observation Stay: Payer: Medicare Other

## 2017-06-22 LAB — NM MYOCAR MULTI W/SPECT W/WALL MOTION / EF
CHL CUP MPHR: 133 {beats}/min
CHL CUP NUCLEAR SDS: 0
CHL CUP NUCLEAR SRS: 1
CHL CUP NUCLEAR SSS: 1
CSEPED: 0 min
CSEPHR: 75 %
Estimated workload: 1 METS
Exercise duration (sec): 1 s
LV dias vol: 84 mL (ref 62–150)
LVSYSVOL: 36 mL
Peak HR: 95 {beats}/min
Rest HR: 62 {beats}/min
TID: 1.09

## 2017-06-22 LAB — BASIC METABOLIC PANEL
Anion gap: 5 (ref 5–15)
BUN: 11 mg/dL (ref 6–20)
CO2: 28 mmol/L (ref 22–32)
CREATININE: 0.81 mg/dL (ref 0.61–1.24)
Calcium: 8.7 mg/dL — ABNORMAL LOW (ref 8.9–10.3)
Chloride: 104 mmol/L (ref 101–111)
GFR calc non Af Amer: >60 mL/min (ref >60)
Glucose, Bld: 141 mg/dL — ABNORMAL HIGH (ref 65–99)
POTASSIUM: 3.4 mmol/L — AB (ref 3.5–5.1)
SODIUM: 137 mmol/L (ref 135–145)

## 2017-06-22 LAB — TROPONIN I: Troponin I: <0.03 ng/mL (ref <0.03)

## 2017-06-22 LAB — CBC
HCT: 35.4 % — ABNORMAL LOW (ref 40.0–52.0)
Hemoglobin: 12 g/dL — ABNORMAL LOW (ref 13.0–18.0)
MCH: 33.3 pg (ref 26.0–34.0)
MCHC: 33.9 g/dL (ref 32.0–36.0)
MCV: 98 fL (ref 80.0–100.0)
Platelets: 158 10*3/uL (ref 150–440)
RBC: 3.62 MIL/uL — AB (ref 4.40–5.90)
RDW: 14.5 % (ref 11.5–14.5)
WBC: 5.3 10*3/uL (ref 3.8–10.6)

## 2017-06-22 MED ORDER — REGADENOSON 0.4 MG/5ML IV SOLN
0.4000 mg | Freq: Once | INTRAVENOUS | Status: AC
  Administered 2017-06-22: 0.4 mg via INTRAVENOUS

## 2017-06-22 MED ORDER — TECHNETIUM TC 99M TETROFOSMIN IV KIT
13.6400 | PACK | Freq: Once | INTRAVENOUS | Status: AC | PRN
  Administered 2017-06-22: 13.64 via INTRAVENOUS

## 2017-06-22 MED ORDER — TECHNETIUM TC 99M TETROFOSMIN IV KIT
32.8600 | PACK | Freq: Once | INTRAVENOUS | Status: AC | PRN
  Administered 2017-06-22: 32.86 via INTRAVENOUS

## 2017-06-22 NOTE — Progress Notes (Signed)
Secured belongings were returned to pt per officer Charlotte Crumb.

## 2017-06-22 NOTE — Discharge Summary (Signed)
[facility] NAME: Brad Brady    MR#:  [id number]  DATE OF BIRTH:  [birth year]  DATE OF ADMISSION:  06/21/2017 ADMITTING PHYSICIAN: Dustin Flock, MD  DATE OF DISCHARGE: No discharge date for patient encounter.  PRIMARY CARE PHYSICIAN: Baxter Hire, MD    ADMISSION DIAGNOSIS:  Moderate risk chest pain [R07.9]  DISCHARGE DIAGNOSIS:  Active Problems:   Chest pain   SECONDARY DIAGNOSIS:   Past Medical History:  Diagnosis Date  . Anemia   . BPH (benign prostatic hyperplasia)   . Coronary artery disease   . GERD (gastroesophageal reflux disease)   . 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 High cholesterol   . Hypertension   . IBS (irritable bowel syndrome)   . Thyroid disease   . Vitamin D deficiency     HOSPITAL COURSE:  1.    Acute atypical chest pain  Patient did rule out for acute coronary syndrome, cardiac enzymes were negative, nuclear medicine stress test performed-negative study, patient was seen by cardiology-no intervention recommended, patient subsequently discharged home with outpatient follow-up with primary care provider and cardiology for reevaluation, patient did well   2.  Essential hypertension  continue atenolol  3.  Hyperlipidemia  continue Lipitor  4.  Hypothyroidism  continue Synthroid  5.  BPH continue Flomax  DISCHARGE CONDITIONS:  On the day of discharge patient is afebrile, exam stable, tolerating diet, ready for discharge home with appropriate follow-up with primary care provider and cardiology as directed, for more specific details please see chart   CONSULTS OBTAINED:  Treatment Team:  Isaias Cowman, MD  DRUG ALLERGIES:  No Known Allergies  DISCHARGE MEDICATIONS:   Allergies as of 06/22/2017   No Known Allergies     Medication List    TAKE these medications   acetaminophen 500 MG tablet Commonly known as:  TYLENOL Take 500 mg by mouth  every 6 (six) hours as needed for moderate pain.   aspirin EC 81 MG tablet Take 81 mg by mouth every other day.   atenolol 25 MG tablet Commonly known as:  TENORMIN Take 1 tablet (25 mg total) by mouth daily.   atorvastatin 20 MG tablet Commonly known as:  LIPITOR Take 20 mg by mouth at bedtime.   dutasteride 0.5 MG capsule Commonly known as:  AVODART Take 0.5 mg by mouth daily.   esomeprazole 40 MG capsule Commonly known as:  NEXIUM Take 40 mg by mouth daily as needed (for acid reflux.).   PRESERVISION/LUTEIN PO Take 1 tablet by mouth daily.   senna-docusate 8.6-50 MG tablet Commonly known as:  Senokot-S Take 2 tablets by mouth at bedtime as needed for mild constipation.   SYNTHROID 112 MCG tablet Generic drug:  levothyroxine Take 112 mcg by mouth at bedtime.   tamsulosin 0.4 MG Caps capsule Commonly known as:  FLOMAX Take 0.4 mg by mouth daily.   vitamin B-12 1000 MCG tablet Commonly known as:  CYANOCOBALAMIN Take 1,000 mcg by mouth daily.   Vitamin D3 2000 units capsule Take 2,000 Units by mouth every morning.        DISCHARGE INSTRUCTIONS:  If you experience worsening of your admission symptoms, develop shortness of breath, life threatening emergency, suicidal or homicidal thoughts you must seek medical attention immediately by calling 911 or calling your MD immediately  if symptoms less severe.  You Must read complete instructions/literature along with all the possible adverse reactions/side effects for all the  Medicines you take and that have been prescribed to you. Take any new Medicines after you have completely understood and accept all the possible adverse reactions/side effects.   Please note  You were cared for by a hospitalist during your hospital stay. If you have any questions about your discharge medications or the care you received while you were in the hospital after you are discharged, you can call the unit and asked to speak with the  hospitalist on call if the hospitalist that took care of you is not available. Once you are discharged, your primary care physician will handle any further medical issues. Please note that NO REFILLS for any discharge medications will be authorized once you are discharged, as it is imperative that you return to your primary care physician (or establish a relationship with a primary care physician if you do not have one) for your aftercare needs so that they can reassess your need for medications and monitor your lab values.    Today   CHIEF COMPLAINT:   Chief Complaint  Patient presents with  . Chest Pain  . Shortness of Breath    HISTORY OF PRESENT ILLNESS:  84 y.o. male with a known history of coronary artery disease, BPH, GERD, hyperlipidemia and hypertension who is presenting with complaint of chest pressure.  Patient reports that his symptoms have been going on for the past few months. At times are related to eating.  He started having chest pressure earlier today not related to eating.  And continue to persist.  Patient did not have any shortness of breath or radiation of the pain.  Due to his symptoms being persistent he decided to come to the ER.  In the ER he was noted to have a EKG there is negative cardiac enzymes are negative.     VITAL SIGNS:  Blood pressure 139/80, pulse 60, temperature 98.1 F (36.7 C), temperature source Oral, resp. rate 18, height 6\' 1"  (1.854 m), weight 80.3 kg (177 lb), SpO2 100 %.  I/O:    Intake/Output Summary (Last 24 hours) at 06/22/2017 1312 Last data filed at 06/22/2017 0214 Gross per 24 hour  Intake -  Output 400 ml  Net -400 ml    PHYSICAL EXAMINATION:  GENERAL:  84 y.o.-year-old patient lying in the bed with no acute distress.  EYES: Pupils equal, round, reactive to light and accommodation. No scleral icterus. Extraocular muscles intact.  HEENT: Head atraumatic, normocephalic. Oropharynx and nasopharynx clear.  NECK:  Supple, no  jugular venous distention. No thyroid enlargement, no tenderness.  LUNGS: Normal breath sounds bilaterally, no wheezing, rales,rhonchi or crepitation. No use of accessory muscles of respiration.  CARDIOVASCULAR: S1, S2 normal. No murmurs, rubs, or gallops.  ABDOMEN: Soft, non-tender, non-distended. Bowel sounds present. No organomegaly or mass.  EXTREMITIES: No pedal edema, cyanosis, or clubbing.  NEUROLOGIC: Cranial nerves II through XII are intact. Muscle strength 5/5 in all extremities. Sensation intact. Gait not checked.  PSYCHIATRIC: The patient is alert and oriented x 3.  SKIN: No obvious rash, lesion, or ulcer.   DATA REVIEW:   CBC Recent Labs  Lab 06/22/17 0056  WBC 5.3  HGB 12.0*  HCT 35.4*  PLT 158    Chemistries  Recent Labs  Lab 06/22/17 0056  NA 137  K 3.4*  CL 104  CO2 28  GLUCOSE 141*  BUN 11  CREATININE 0.81  CALCIUM 8.7*    Cardiac Enzymes Recent Labs  Lab 06/22/17 0719  TROPONINI <0.03  Microbiology Results  No results found for this or any previous visit.  RADIOLOGY:  Dg Chest 2 View  Result Date: 06/21/2017 CLINICAL DATA:  Chest pain EXAM: CHEST - 2 VIEW COMPARISON:  11/13/2015 FINDINGS: Hyperinflation with chronic bronchitic changes. No acute airspace disease or pleural effusion. Stable cardiomediastinal silhouette with aortic atherosclerosis. No pneumothorax. IMPRESSION: No active cardiopulmonary disease. Hyperinflation with mild bronchitic changes. Electronically Signed   By: Donavan Foil M.D.   On: 06/21/2017 19:24   Nm Myocar Multi W/spect W/wall Motion / Ef  Result Date: 06/22/2017  Blood pressure demonstrated a normal response to exercise.  There was no ST segment deviation noted during stress.  Defect 1: There is a small defect of mild severity present in the apex location.  The study is normal.  This is a low risk study.  The left ventricular ejection fraction is mildly decreased (45-54%).     EKG:   Orders placed or  performed during the hospital encounter of 06/21/17  . ED EKG within 10 minutes  . ED EKG within 10 minutes  . EKG 12-Lead  . EKG 12-Lead      Management plans discussed with the patient, family and they are in agreement.  CODE STATUS:     Code Status Orders  (From admission, onward)        Start     Ordered   06/21/17 2220  Limited resuscitation (code)  Continuous    Question Answer Comment  In the event of cardiac or respiratory ARREST: Initiate Code Blue, Call Rapid Response Yes   In the event of cardiac or respiratory ARREST: Perform CPR Yes   In the event of cardiac or respiratory ARREST: Perform Intubation/Mechanical Ventilation No   In the event of cardiac or respiratory ARREST: Use NIPPV/BiPAp only if indicated Yes   In the event of cardiac or respiratory ARREST: Administer ACLS medications if indicated Yes   In the event of cardiac or respiratory ARREST: Perform Defibrillation or Cardioversion if indicated Yes      06/21/17 2219    Code Status History    Date Active Date Inactive Code Status Order ID Comments User Context   09/05/2014 1257 09/07/2014 1855 Full Code [id number]  Idelle Crouch, MD Inpatient    Advance Directive Documentation     Most Recent Value  Type of Advance Directive  Healthcare Power of [place], Living will  Pre-existing out of facility DNR order (yellow form or pink MOST form)  -  "MOST" Form in Place?  -      TOTAL TIME TAKING CARE OF THIS PATIENT: 45 minutes.    Avel Peace Josey Dettmann M.D on 06/22/2017 at 1:12 PM  Between 7am to 6pm - Pager - [phone]  After 6pm go to [email or web address] - password [email or web address] [facility]  Office  [phone]  CC: Primary care physician; Baxter Hire, MD   Note: This dictation was prepared with Dragon dictation along with smaller phrase technology. Any transcriptional errors that result from this process are unintentional.

## 2017-06-22 NOTE — Consult Note (Signed)
[facility] Cardiology  CARDIOLOGY CONSULT NOTE  Patient ID: Brad Brady MRN: [id number] DOB/AGE: March 29, 1927 84 y.o.  Admit date: 06/21/2017 Referring Physician Salary Primary Physician [facility] Primary Cardiologist Jazyiah Yiu Reason for Consultation chest pain  HPI: 84 year old gentleman referred for evaluation of chest pain.  Patient has known coronary artery disease, status post PTCA PDA 08/24/1992.  She has been in his usual state of health until the past 2 months, when he is noted recurrent episodes of exertional chest discomfort described as pressure sensation.  Patient labs notable for negative troponin less than 0.033.  ECG reveals sinus rhythm with right bundle branch block.  Review of systems complete and found to be negative unless listed above     Past Medical History:  Diagnosis Date  . Anemia   . BPH (benign prostatic hyperplasia)   . Coronary artery disease   . GERD (gastroesophageal reflux disease)   . 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 High cholesterol   . Hypertension   . IBS (irritable bowel syndrome)   . Thyroid disease   . Vitamin D deficiency     Past Surgical History:  Procedure Laterality Date  . CARDIAC CATHETERIZATION    . COLONOSCOPY    . COLONOSCOPY WITH PROPOFOL N/A 10/20/2014   Procedure: COLONOSCOPY WITH PROPOFOL;  Surgeon: Manya Silvas, MD;  Location: [facility] ENDOSCOPY;  Service: Endoscopy;  Laterality: N/A;  . ESOPHAGOGASTRODUODENOSCOPY (EGD) WITH PROPOFOL N/A 10/20/2014   Procedure: ESOPHAGOGASTRODUODENOSCOPY (EGD) WITH PROPOFOL;  Surgeon: Manya Silvas, MD;  Location: [facility] ENDOSCOPY;  Service: Endoscopy;  Laterality: N/A;  . HERNIA REPAIR    . PTCA of PDA      Medications Prior to Admission  Medication Sig Dispense Refill Last Dose  . acetaminophen (TYLENOL) 500 MG tablet Take 500 mg by mouth every 6 (six) hours as needed for moderate pain.    PRN at PRN  . aspirin EC 81 MG tablet Take 81 mg by mouth every other day.   As directed  at As directed  . atenolol (TENORMIN) 25 MG tablet Take 1 tablet (25 mg total) by mouth daily. 30 tablet 0 06/21/2017 at 0800  . atorvastatin (LIPITOR) 20 MG tablet Take 20 mg by mouth at bedtime.   06/20/2017 at 2000  . Cholecalciferol (VITAMIN D3) 2000 UNITS capsule Take 2,000 Units by mouth every morning.   06/21/2017 at 0800  . dutasteride (AVODART) 0.5 MG capsule Take 0.5 mg by mouth daily.   06/21/2017 at 0800  . esomeprazole (NEXIUM) 40 MG capsule Take 40 mg by mouth daily as needed (for acid reflux.).    PRN at PRN  . Multiple Vitamins-Minerals (PRESERVISION/LUTEIN PO) Take 1 tablet by mouth daily.   06/21/2017 at 0800  . senna-docusate (SENOKOT-S) 8.6-50 MG per tablet Take 2 tablets by mouth at bedtime as needed for mild constipation. 30 tablet 0 PRN at PRN  . SYNTHROID 112 MCG tablet Take 112 mcg by mouth at bedtime.    06/20/2017 at 2000  . tamsulosin (FLOMAX) 0.4 MG CAPS capsule Take 0.4 mg by mouth daily.   06/21/2017 at 0800  . vitamin B-12 (CYANOCOBALAMIN) 1000 MCG tablet Take 1,000 mcg by mouth daily.    06/21/2017 at 0800   Social History   Socioeconomic History  . Marital status: Married    Spouse name: Not on file  . Number of children: Not on file  . Years of education: Not on file  . Highest education level: Not on file  Occupational History  .  Not on file  Social Needs  . Financial resource strain: Not on file  . Food insecurity:    Worry: Not on file    Inability: Not on file  . Transportation needs:    Medical: Not on file    Non-medical: Not on file  Tobacco Use  . Smoking status: Never Smoker  . Smokeless tobacco: Never Used  Substance and Sexual Activity  . Alcohol use: No  . Drug use: No  . Sexual activity: Not on file  Lifestyle  . Physical activity:    Days per week: Not on file    Minutes per session: Not on file  . Stress: Not on file  Relationships  . Social connections:    Talks on phone: Not on file    Gets together: Not on file    Attends  religious service: Not on file    Active member of club or organization: Not on file    Attends meetings of clubs or organizations: Not on file    Relationship status: Not on file  . Intimate partner violence:    Fear of current or ex partner: Not on file    Emotionally abused: Not on file    Physically abused: Not on file    Forced sexual activity: Not on file  Other Topics Concern  . Not on file  Social History Narrative  . Not on file    No family history on file.    Review of systems complete and found to be negative unless listed above      PHYSICAL EXAM  General: Well developed, well nourished, in no acute distress HEENT:  Normocephalic and atramatic Neck:  No JVD.  Lungs: Clear bilaterally to auscultation and percussion. Heart: HRRR . Normal S1 and S2 without gallops or murmurs.  Abdomen: Bowel sounds are positive, abdomen soft and non-tender  Msk:  Back normal, normal gait. Normal strength and tone for age. Extremities: No clubbing, cyanosis or edema.   Neuro: Alert and oriented X 3. Psych:  Good affect, responds appropriately  Labs:   Lab Results  Component Value Date   WBC 5.3 06/22/2017   HGB 12.0 (L) 06/22/2017   HCT 35.4 (L) 06/22/2017   MCV 98.0 06/22/2017   PLT 158 06/22/2017    Recent Labs  Lab 06/22/17 0056  NA 137  K 3.4*  CL 104  CO2 28  BUN 11  CREATININE 0.81  CALCIUM 8.7*  GLUCOSE 141*   Lab Results  Component Value Date   CKTOTAL 174 10/14/2012   CKMB 5.4 (H) 10/14/2012   TROPONINI <0.03 06/22/2017    Lab Results  Component Value Date   CHOL 136 09/07/2014   Lab Results  Component Value Date   HDL 41 09/07/2014   Lab Results  Component Value Date   LDLCALC 81 09/07/2014   Lab Results  Component Value Date   TRIG 68 09/07/2014   Lab Results  Component Value Date   CHOLHDL 3.3 09/07/2014   No results found for: LDLDIRECT    Radiology: Dg Chest 2 View  Result Date: 06/21/2017 CLINICAL DATA:  Chest pain EXAM:  CHEST - 2 VIEW COMPARISON:  11/13/2015 FINDINGS: Hyperinflation with chronic bronchitic changes. No acute airspace disease or pleural effusion. Stable cardiomediastinal silhouette with aortic atherosclerosis. No pneumothorax. IMPRESSION: No active cardiopulmonary disease. Hyperinflation with mild bronchitic changes. Electronically Signed   By: Donavan Foil M.D.   On: 06/21/2017 19:24    EKG: Sinus rhythm with right bundle  branch block  ASSESSMENT AND PLAN:   1.  Chest pain, with negative troponin and nondiagnostic ECG 2.  CAD, status post PTCA PDA 1994  Recommendations  1.  Continue current medications 2.  Received with [facility] Adam Myoview study  Signed: Isaias Cowman MD,PhD, [facility] 06/22/2017, 9:37 AM

## 2017-06-22 NOTE — Progress Notes (Signed)
MD Salary was notified that stress test was negative per MD Paraschos.

## 2017-06-22 NOTE — Progress Notes (Signed)
Room air. NSR. Stress test was negative. A & O. IV and tele removed. Discharge instructions given to pt. Pt nephew to take pt home. Pt has no further concerns at this time.

## 2017-06-22 NOTE — ED Provider Notes (Signed)
[place] Emergency Department Provider Note   ____________________________________________   First MD Initiated Contact with Patient 06/21/17 2026     (approximate)  I have reviewed the triage vital signs and the nursing notes.   HISTORY  Chief Complaint Chest Pain and Shortness of Breath    HPI Brad Brady is a 84 y.o. male resents for evaluation of chest pain with shortness of breath  Patient reports for about 2 to 3 months now is noticed when he exerts himself or exercise to be experiencing shortness of breath feeling and then sometimes discomfort or pressure across his chest that will cause him to stop exercising.  This comfort in his chest improves at rest.  Seems to be getting worse for the last couple of months and today while exerting himself the pain seemed to become noticeably worse prompting him to come the emergency room for evaluation.  Does have a history of coronary disease.  Reports he had balloon angioplasty in the past.  Did take 2 baby aspirin aspirin today.  Currently reports some mild sensation of pressure across the chest but yet but is improving    Past Medical History:  Diagnosis Date  . Anemia   . BPH (benign prostatic hyperplasia)   . Coronary artery disease   . GERD (gastroesophageal reflux disease)   . 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 High cholesterol   . Hypertension   . IBS (irritable bowel syndrome)   . Thyroid disease   . Vitamin D deficiency     Patient Active Problem List   Diagnosis Date Noted  . Chest pain 06/21/2017  . Hypertension 10/02/2016  . High cholesterol 10/02/2016  . 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 Iliac artery aneurysm ([place]) 10/02/2016  . Unstable angina ([place]) 09/05/2014  . ASCVD (arteriosclerotic cardiovascular disease) 09/05/2014  . Abnormal EKG 09/05/2014    Past Surgical History:  Procedure Laterality Date  . CARDIAC CATHETERIZATION    . COLONOSCOPY    .  COLONOSCOPY WITH PROPOFOL N/A 10/20/2014   Procedure: COLONOSCOPY WITH PROPOFOL;  Surgeon: Manya Silvas, MD;  Location: [facility] ENDOSCOPY;  Service: Endoscopy;  Laterality: N/A;  . ESOPHAGOGASTRODUODENOSCOPY (EGD) WITH PROPOFOL N/A 10/20/2014   Procedure: ESOPHAGOGASTRODUODENOSCOPY (EGD) WITH PROPOFOL;  Surgeon: Manya Silvas, MD;  Location: [facility] ENDOSCOPY;  Service: Endoscopy;  Laterality: N/A;  . HERNIA REPAIR    . PTCA of PDA      Prior to Admission medications   Medication Sig Start Date End Date Taking? Authorizing Provider  acetaminophen (TYLENOL) 500 MG tablet Take 500 mg by mouth every 6 (six) hours as needed for moderate pain.    Yes [provider]  aspirin EC 81 MG tablet Take 81 mg by mouth every other day.   Yes [provider]  atenolol (TENORMIN) 25 MG tablet Take 1 tablet (25 mg total) by mouth daily. 09/07/14  Yes Max Sane, MD  atorvastatin (LIPITOR) 20 MG tablet Take 20 mg by mouth at bedtime. 06/03/14  Yes [provider]  Cholecalciferol (VITAMIN D3) 2000 UNITS capsule Take 2,000 Units by mouth every morning.   Yes [provider]  dutasteride (AVODART) 0.5 MG capsule Take 0.5 mg by mouth daily. 07/27/14  Yes [provider]  esomeprazole (NEXIUM) 40 MG capsule Take 40 mg by mouth daily as needed (for acid reflux.).  06/03/14  Yes [provider]  Multiple Vitamins-Minerals (PRESERVISION/LUTEIN PO) Take 1 tablet by mouth daily.   Yes [provider]  senna-docusate (SENOKOT-S) 8.6-50 MG per tablet Take 2 tablets by mouth at bedtime as needed for mild constipation. 09/07/14  Yes Max Sane, MD  SYNTHROID 112 MCG tablet Take 112 mcg by mouth at bedtime.  06/03/14  Yes [provider]  tamsulosin (FLOMAX) 0.4 MG CAPS capsule Take 0.4 mg by mouth daily. 07/27/14  Yes [provider]  vitamin B-12 (CYANOCOBALAMIN) 1000 MCG tablet Take 1,000 mcg by mouth daily.    Yes [provider]     Allergies Patient has no known allergies.  No family history on file.  Social History Social History   Tobacco Use  . Smoking status: Never Smoker  . Smokeless tobacco: Never Used  Substance Use Topics  . Alcohol use: No  . Drug use: No    Review of Systems Constitutional: No fever/chills Eyes: No visual changes. ENT: No sore throat. Cardiovascular: Denies chest pain.  Pressure. Respiratory: Denies shortness of breath.  Reports a feeling shortness of breath when exerting himself which goes away with rest.  Not short of breath at present Gastrointestinal: No abdominal pain.  No nausea, no vomiting.  No diarrhea.  No constipation. Genitourinary: Negative for dysuria. Musculoskeletal: Negative for back pain. Skin: Negative for rash. Neurological: Negative for headaches, focal weakness or numbness.    ____________________________________________   PHYSICAL EXAM:  VITAL SIGNS: ED Triage Vitals  Enc Vitals Group     BP 06/21/17 1849 (!) 167/78     Pulse Rate 06/21/17 1849 71     Resp 06/21/17 1849 12     Temp 06/21/17 1849 98.2 F (36.8 C)     Temp Source 06/21/17 1849 Oral     SpO2 06/21/17 1849 100 %     Weight 06/21/17 1844 177 lb (80.3 kg)     Height 06/21/17 1844 6\' 1"  (1.854 m)     Head Circumference --      Peak Flow --      Pain Score 06/21/17 1844 7     Pain Loc --      Pain Edu? --      Excl. in [place]? --     Constitutional: Alert and oriented. Well appearing and in no acute distress. Eyes: Conjunctivae are normal. Head: Atraumatic. Nose: No congestion/rhinnorhea. Mouth/Throat: Mucous membranes are moist. Neck: No stridor.   Cardiovascular: Normal rate, regular rhythm. Grossly normal heart sounds.  Good peripheral circulation. Respiratory: Normal respiratory effort.  No retractions. Lungs CTAB. Gastrointestinal: Soft and nontender. No distention. Musculoskeletal: No lower extremity tenderness nor edema. Neurologic:  Normal speech and language.  No gross focal neurologic deficits are appreciated.  Skin:  Skin is warm, dry and intact. No rash noted. Psychiatric: Mood and affect are normal. Speech and behavior are normal.  ____________________________________________   LABS (all labs ordered are listed, but only abnormal results are displayed)  Labs Reviewed  CBC - Abnormal; Notable for the following components:      Result Value   RBC 3.90 (*)    HCT 37.9 (*)    MCH 34.5 (*)    All other components within normal limits  BASIC METABOLIC PANEL  TROPONIN I  CBC  BASIC METABOLIC PANEL  TROPONIN I  TROPONIN I   ____________________________________________  EKG  Reviewed enterotomy at 1846 Heart rate 65 QRS 150 QTC 470 Normal sinus rhythm, right bundle branch block without ischemic changes to noted ____________________________________________  RADIOLOGY  Chest x-ray reviewed, negative for acute ____________________________________________   PROCEDURES  Procedure(s) performed: None  Procedures  Critical Care performed: No  ____________________________________________   INITIAL IMPRESSION / ASSESSMENT AND PLAN / ED COURSE  Pertinent labs & imaging results that were available during my care of the patient were reviewed by me and considered in my medical decision making (see chart for details).  Differential diagnosis includes, but is not limited to, ACS, aortic dissection, pulmonary embolism, cardiac tamponade, pneumothorax, pneumonia, pericarditis, myocarditis, GI-related causes including esophagitis/gastritis, and musculoskeletal chest wall pain.    Thus far patient showing no signs of an acute coronary syndrome except potentially unstable angina which I think is what the patient is describing.  No associated abdominal symptoms.  No pleuritic pain.  No signs or symptoms to suggest dissection, pulmonary embolism, or other acute intra-chest etiology except I do have elevated concern for possible  angina.  Discussed with patient, will treat with nitrates, aspirin, and admit for further work-up.  No evidence of STEMI at this time.     Patient and his wife both agreeable with plan for admission  ____________________________________________   FINAL CLINICAL IMPRESSION(S) / ED DIAGNOSES  Final diagnoses:  Moderate risk chest pain      NEW MEDICATIONS STARTED DURING THIS VISIT:  Current Discharge Medication List       Note:  This document was prepared using Dragon voice recognition software and may include unintentional dictation errors.     Delman Kitten, MD 06/22/17 Laureen Abrahams

## 2017-11-18 ENCOUNTER — Other Ambulatory Visit: Payer: Self-pay | Admitting: Nurse Practitioner

## 2017-11-18 DIAGNOSIS — R634 Abnormal weight loss: Secondary | ICD-10-CM

## 2017-11-18 DIAGNOSIS — R1909 Other intra-abdominal and pelvic swelling, mass and lump: Secondary | ICD-10-CM

## 2017-11-21 ENCOUNTER — Ambulatory Visit
Admission: RE | Admit: 2017-11-21 | Discharge: 2017-11-21 | Disposition: A | Discharge status: Home / Self Care | Payer: Medicare Other | Source: Ambulatory Visit | Attending: Nurse Practitioner | Admitting: Nurse Practitioner

## 2017-11-21 DIAGNOSIS — N433 Hydrocele, unspecified: Secondary | ICD-10-CM | POA: Insufficient documentation

## 2017-11-21 DIAGNOSIS — R634 Abnormal weight loss: Secondary | ICD-10-CM | POA: Insufficient documentation

## 2017-11-21 DIAGNOSIS — I700 Atherosclerosis of aorta: Secondary | ICD-10-CM | POA: Diagnosis not present

## 2017-11-21 DIAGNOSIS — K4091 Unilateral inguinal hernia, without obstruction or gangrene, recurrent: Secondary | ICD-10-CM | POA: Insufficient documentation

## 2017-11-21 DIAGNOSIS — R1909 Other intra-abdominal and pelvic swelling, mass and lump: Secondary | ICD-10-CM

## 2017-11-21 MED ORDER — IOPAMIDOL (ISOVUE-300) INJECTION 61%
100.0000 mL | Freq: Once | INTRAVENOUS | Status: AC | PRN
  Administered 2017-11-21: 100 mL via INTRAVENOUS

## 2017-11-27 ENCOUNTER — Other Ambulatory Visit: Payer: Self-pay | Admitting: Nurse Practitioner

## 2017-11-27 DIAGNOSIS — R1319 Other dysphagia: Secondary | ICD-10-CM

## 2017-11-29 DIAGNOSIS — K219 Gastro-esophageal reflux disease without esophagitis: Secondary | ICD-10-CM | POA: Insufficient documentation

## 2017-11-29 DIAGNOSIS — N3289 Other specified disorders of bladder: Secondary | ICD-10-CM | POA: Insufficient documentation

## 2017-12-01 DIAGNOSIS — R1031 Right lower quadrant pain: Secondary | ICD-10-CM | POA: Insufficient documentation

## 2017-12-02 ENCOUNTER — Telehealth: Payer: Self-pay | Admitting: Urology

## 2017-12-02 NOTE — Telephone Encounter (Signed)
pt's PCP's office called Lanelle Bal lm on my VM that the patient wanted to cx app he has a urologist and will follow up with them   Sharyn Lull

## 2017-12-03 ENCOUNTER — Ambulatory Visit
Admission: RE | Admit: 2017-12-03 | Discharge: 2017-12-03 | Disposition: A | Discharge status: Home / Self Care | Payer: Medicare Other | Source: Ambulatory Visit | Attending: Nurse Practitioner | Admitting: Nurse Practitioner

## 2017-12-03 DIAGNOSIS — K228 Other specified diseases of esophagus: Secondary | ICD-10-CM | POA: Diagnosis not present

## 2017-12-03 DIAGNOSIS — R1319 Other dysphagia: Secondary | ICD-10-CM | POA: Diagnosis present

## 2017-12-03 DIAGNOSIS — K449 Diaphragmatic hernia without obstruction or gangrene: Secondary | ICD-10-CM | POA: Diagnosis not present

## 2017-12-04 ENCOUNTER — Inpatient Hospital Stay: Payer: Medicare Other | Attending: Internal Medicine | Admitting: Internal Medicine

## 2017-12-04 ENCOUNTER — Other Ambulatory Visit: Payer: Self-pay

## 2017-12-04 ENCOUNTER — Encounter: Payer: Self-pay | Admitting: Internal Medicine

## 2017-12-04 DIAGNOSIS — K449 Diaphragmatic hernia without obstruction or gangrene: Secondary | ICD-10-CM | POA: Diagnosis not present

## 2017-12-04 DIAGNOSIS — I10 Essential (primary) hypertension: Secondary | ICD-10-CM | POA: Diagnosis not present

## 2017-12-04 DIAGNOSIS — K589 Irritable bowel syndrome without diarrhea: Secondary | ICD-10-CM

## 2017-12-04 DIAGNOSIS — K219 Gastro-esophageal reflux disease without esophagitis: Secondary | ICD-10-CM | POA: Diagnosis not present

## 2017-12-04 DIAGNOSIS — Z79899 Other long term (current) drug therapy: Secondary | ICD-10-CM

## 2017-12-04 DIAGNOSIS — I2510 Atherosclerotic heart disease of native coronary artery without angina pectoris: Secondary | ICD-10-CM | POA: Diagnosis not present

## 2017-12-04 DIAGNOSIS — Z7982 Long term (current) use of aspirin: Secondary | ICD-10-CM

## 2017-12-04 DIAGNOSIS — K649 Unspecified hemorrhoids: Secondary | ICD-10-CM | POA: Diagnosis not present

## 2017-12-04 DIAGNOSIS — M549 Dorsalgia, unspecified: Secondary | ICD-10-CM | POA: Diagnosis not present

## 2017-12-04 DIAGNOSIS — K228 Other specified diseases of esophagus: Secondary | ICD-10-CM

## 2017-12-04 DIAGNOSIS — G8929 Other chronic pain: Secondary | ICD-10-CM | POA: Diagnosis not present

## 2017-12-04 DIAGNOSIS — E079 Disorder of thyroid, unspecified: Secondary | ICD-10-CM

## 2017-12-04 DIAGNOSIS — K30 Functional dyspepsia: Secondary | ICD-10-CM | POA: Diagnosis not present

## 2017-12-04 DIAGNOSIS — K5900 Constipation, unspecified: Secondary | ICD-10-CM

## 2017-12-04 DIAGNOSIS — K7689 Other specified diseases of liver: Secondary | ICD-10-CM

## 2017-12-04 DIAGNOSIS — R1310 Dysphagia, unspecified: Secondary | ICD-10-CM

## 2017-12-04 DIAGNOSIS — E7800 Pure hypercholesterolemia, unspecified: Secondary | ICD-10-CM

## 2017-12-04 DIAGNOSIS — D638 Anemia in other chronic diseases classified elsewhere: Secondary | ICD-10-CM | POA: Insufficient documentation

## 2017-12-04 DIAGNOSIS — D649 Anemia, unspecified: Secondary | ICD-10-CM

## 2017-12-04 DIAGNOSIS — K4091 Unilateral inguinal hernia, without obstruction or gangrene, recurrent: Secondary | ICD-10-CM

## 2017-12-04 DIAGNOSIS — R634 Abnormal weight loss: Secondary | ICD-10-CM | POA: Diagnosis not present

## 2017-12-04 DIAGNOSIS — K5790 Diverticulosis of intestine, part unspecified, without perforation or abscess without bleeding: Secondary | ICD-10-CM

## 2017-12-04 DIAGNOSIS — K5730 Diverticulosis of large intestine without perforation or abscess without bleeding: Secondary | ICD-10-CM | POA: Diagnosis not present

## 2017-12-04 DIAGNOSIS — E559 Vitamin D deficiency, unspecified: Secondary | ICD-10-CM

## 2017-12-04 DIAGNOSIS — N433 Hydrocele, unspecified: Secondary | ICD-10-CM

## 2017-12-04 DIAGNOSIS — N400 Enlarged prostate without lower urinary tract symptoms: Secondary | ICD-10-CM

## 2017-12-04 NOTE — Progress Notes (Signed)
[place] NOTE  Patient Care Team: Baxter Hire, MD as PCP - General (Internal Medicine)  CHIEF COMPLAINTS/PURPOSE OF CONSULTATION:  ANEMIA  #Chronic anemia normocytic since 2015 ~hemoglobin around 12.  Normal white count platelets.  Iron saturation 83% M19 folic acid/SPEP  Normal.  question low-grade MDS; no bone marrow biopsy. --------------------------------------------------------------------------------------------------------------------------------------------------------------------------------------------------------------------------------------------------  # chronic digestive problems [[place]; Dr.Eliott; [place]]- 8/24//2016 for IDA and heme positive stool and had an EGD/colonoscopy and capsule endoscopy.  10/20/14 EGD: normal esophagus, small hiatal hernia, and patchy minimal inflammation characterized by erythema found in the gastric body and gastric antrum. Normal examined duodenum. Pathology returned of the stomach showing moderate reactive gastropathy negative for H. pylori. No repeat EGD.  10/20/2014 Colonoscopy: diverticulosis, internal hemorrhoids exam was otherwise normal.   10/20/14 Capsule endoscopy: a few tiny red spot a few tiny red spots. Mild gastritis, one small bowel erosions superficial. A few flecks of dark material in the stomach possible old blood ------------------------------------------------------------------------------------------------------------------------------------------------------------------------------------------------------------------------------------------------  # CAD [Dr.Parashoes;[facility]]   No history exists.     HISTORY OF PRESENTING ILLNESS:  Brad Brady 84 y.o.  male has been referred to [place] for further evaluation recommendations for his anemia.  Patient states that he has chronic indigestion chronic constipation for which he had EGD colonoscopy that did not show any obvious etiology.  Except for hiatal  hernia.   Patient the process was noted to have anemia for which he has been referred to [place].  Patient denies any unusual shortness of breath or cough.  Has chronic mild back pain.  Review of Systems  Constitutional: Negative for chills, diaphoresis, fever, malaise/fatigue and weight loss.  HENT: Negative for nosebleeds and sore throat.   Eyes: Negative for double vision.  Respiratory: Negative for cough, hemoptysis, sputum production, shortness of breath and wheezing.   Cardiovascular: Negative for chest pain, palpitations, orthopnea and leg swelling.  Gastrointestinal: Positive for heartburn. Negative for abdominal pain, blood in stool, constipation, diarrhea, melena, nausea and vomiting.  Genitourinary: Negative for dysuria, frequency and urgency.  Musculoskeletal: Positive for back pain and joint pain.  Skin: Negative.  Negative for itching and rash.  Neurological: Positive for dizziness. Negative for tingling, focal weakness, weakness and headaches.  Endo/Heme/Allergies: Does not bruise/bleed easily.  Psychiatric/Behavioral: Negative for depression. The patient is not nervous/anxious and does not have insomnia.      MEDICAL HISTORY:  Past Medical History:  Diagnosis Date  . Anemia   . BPH (benign prostatic hyperplasia)   . Coronary artery disease   . GERD (gastroesophageal reflux disease)   . 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 High cholesterol   . Hypertension   . IBS (irritable bowel syndrome)   . Thyroid disease   . Vitamin D deficiency     SURGICAL HISTORY: Past Surgical History:  Procedure Laterality Date  . CARDIAC CATHETERIZATION    . COLONOSCOPY    . COLONOSCOPY WITH PROPOFOL N/A 10/20/2014   Procedure: COLONOSCOPY WITH PROPOFOL;  Surgeon: Manya Silvas, MD;  Location: [facility] ENDOSCOPY;  Service: Endoscopy;  Laterality: N/A;  . ESOPHAGOGASTRODUODENOSCOPY (EGD) WITH PROPOFOL N/A 10/20/2014   Procedure: ESOPHAGOGASTRODUODENOSCOPY (EGD) WITH PROPOFOL;   Surgeon: Manya Silvas, MD;  Location: [facility] ENDOSCOPY;  Service: Endoscopy;  Laterality: N/A;  . HERNIA REPAIR    . PTCA of PDA      SOCIAL HISTORY: no alcohol/ no smoking; live [place]; with wife.  Social History   Socioeconomic History  . Marital status: Married  Spouse name: Not on file  . Number of children: Not on file  . Years of education: Not on file  . Highest education level: Not on file  Occupational History  . Not on file  Social Needs  . Financial resource strain: Not on file  . Food insecurity:    Worry: Not on file    Inability: Not on file  . Transportation needs:    Medical: Not on file    Non-medical: Not on file  Tobacco Use  . Smoking status: Never Smoker  . Smokeless tobacco: Never Used  Substance and Sexual Activity  . Alcohol use: No  . Drug use: No  . Sexual activity: Not on file  Lifestyle  . Physical activity:    Days per week: Not on file    Minutes per session: Not on file  . Stress: Not on file  Relationships  . Social connections:    Talks on phone: Not on file    Gets together: Not on file    Attends religious service: Not on file    Active member of club or organization: Not on file    Attends meetings of clubs or organizations: Not on file    Relationship status: Not on file  . Intimate partner violence:    Fear of current or ex partner: Not on file    Emotionally abused: Not on file    Physically abused: Not on file    Forced sexual activity: Not on file  Other Topics Concern  . Not on file  Social History Narrative  . Not on file    FAMILY HISTORY: History reviewed. No pertinent family history.  ALLERGIES:  has No Known Allergies.  MEDICATIONS:  Current Outpatient Medications  Medication Sig Dispense Refill  . acetaminophen (TYLENOL) 500 MG tablet Take 500 mg by mouth every 6 (six) hours as needed for moderate pain.     [name] Kitchen aspirin EC 81 MG tablet Take 81 mg by mouth every other day.    [name] Kitchen atenolol (TENORMIN) 25  MG tablet Take 1 tablet (25 mg total) by mouth daily. 30 tablet 0  . atorvastatin (LIPITOR) 20 MG tablet Take 20 mg by mouth at bedtime.    . Cholecalciferol (VITAMIN D3) 2000 UNITS capsule Take 2,000 Units by mouth every morning.    . dutasteride (AVODART) 0.5 MG capsule Take 0.5 mg by mouth daily.    [name] Kitchen esomeprazole (NEXIUM) 40 MG capsule Take 40 mg by mouth daily as needed (for acid reflux.).     [name] Kitchen Ferrous Sulfate (IRON) 325 (65 Fe) MG TABS Take 1 tablet by mouth every other day.    . isosorbide mononitrate (IMDUR) 30 MG 24 hr tablet Take 1 tablet by mouth daily.    . Multiple Vitamins-Minerals (PRESERVISION/LUTEIN PO) Take 1 tablet by mouth daily.    [name] Kitchen senna-docusate (SENOKOT-S) 8.6-50 MG per tablet Take 2 tablets by mouth at bedtime as needed for mild constipation. 30 tablet 0  . SYNTHROID 112 MCG tablet Take 112 mcg by mouth at bedtime.     . tamsulosin (FLOMAX) 0.4 MG CAPS capsule Take 0.4 mg by mouth daily.    . vitamin B-12 (CYANOCOBALAMIN) 1000 MCG tablet Take 1,000 mcg by mouth daily.      No current facility-administered medications for this visit.       [name] Kitchen  PHYSICAL EXAMINATION: ECOG PERFORMANCE STATUS: 1 - Symptomatic but completely ambulatory  Vitals:   12/04/17 1130  BP: 113/68  Pulse: (!) 58  Resp: 20  Temp: 97.6 F (36.4 C)   There were no vitals filed for this visit.  Physical Exam  Constitutional: He is oriented to person, place, and time and well-developed, well-nourished, and in no distress.  HENT:  Head: Normocephalic and atraumatic.  Mouth/Throat: Oropharynx is clear and moist. No oropharyngeal exudate.  Eyes: Pupils are equal, round, and reactive to light.  Neck: Normal range of motion. Neck supple.  Cardiovascular: Normal rate and regular rhythm.  Pulmonary/Chest: No respiratory distress. He has no wheezes.  Abdominal: Soft. Bowel sounds are normal. He exhibits no distension and no mass. There is no tenderness. There is no rebound and no guarding.   Musculoskeletal: Normal range of motion. He exhibits no edema or tenderness.  Neurological: He is alert and oriented to person, place, and time.  Skin: Skin is warm.  Psychiatric: Affect normal.     LABORATORY DATA:  I have reviewed the data as listed Lab Results  Component Value Date   WBC 5.3 06/22/2017   HGB 12.0 (L) 06/22/2017   HCT 35.4 (L) 06/22/2017   MCV 98.0 06/22/2017   PLT 158 06/22/2017   Recent Labs    06/21/17 1851 06/22/17 0056  NA 137 137  K 3.5 3.4*  CL 103 104  CO2 25 28  GLUCOSE 95 141*  BUN 11 11  CREATININE 0.81 0.81  CALCIUM 9.1 8.7*  GFRNONAA >60 >60  GFRAA >60 >60    RADIOGRAPHIC STUDIES: I have personally reviewed the radiological images as listed and agreed with the findings in the report. Ct Abdomen Pelvis W Contrast  Result Date: 11/22/2017 CLINICAL DATA:  Bilateral inguinal swelling. Weight loss. History of anemia. Prior bilateral inguinal hernia repairs. EXAM: CT ABDOMEN AND PELVIS WITH CONTRAST TECHNIQUE: Multidetector CT imaging of the abdomen and pelvis was performed using the standard protocol following bolus administration of intravenous contrast. CONTRAST:  151m ISOVUE-300 IOPAMIDOL (ISOVUE-300) INJECTION 61% COMPARISON:  Abdominal ultrasound 11/13/2005 FINDINGS: Lower chest: Clear lung bases. Normal heart size without pericardial or pleural effusion. Mitral annular calcifications. Hepatobiliary: High left hepatic lobe cysts. Normal gallbladder, without biliary ductal dilatation. Pancreas: Normal, without mass or ductal dilatation. Spleen: Normal in size, without focal abnormality. Adrenals/Urinary Tract: Normal adrenal glands. Mild renal cortical thinning, within normal variation for age. Bilateral extrarenal pelves. Primarily decompressed urinary bladder. Apparent mild wall thickening is likely secondary. Fat stranding adjacent the right-side of the bladder on image 61/2 is favored to be postoperative. Stomach/Bowel: Normal stomach,  without wall thickening. Extensive colonic diverticulosis. Normal terminal ileum and appendix. Normal small bowel. Vascular/Lymphatic: Advanced aortic and branch vessel atherosclerosis. No abdominopelvic adenopathy. Reproductive: Normal prostate. Moderate bilateral hydroceles, including on 92/2. Other: No significant free fluid. Tiny recurrent or residual fat containing left inguinal hernia, most apparent on coronal image 30. Musculoskeletal: Lumbosacral spondylosis with grade 1 L5-S1 anterolisthesis. Moderate convex right lumbar spine curvature. IMPRESSION: 1. Tiny residual or recurrent fat containing left inguinal hernia. 2. Apparent bladder wall thickening, with adjacent pericystic fat stranding. The wall thickening is favored to be due to underdistention. The pericystic fat stranding is favored to be postoperative. Consider correlation with urinalysis to exclude cystitis. 3.  Aortic Atherosclerosis (ICD10-I70.0). 4. Bilateral hydroceles. Electronically Signed   By: KAbigail MiyamotoM.D.   On: 11/22/2017 08:46   Dg Esophagus  Result Date: 12/03/2017 CLINICAL DATA:  Many years of dysphagia, epigastric pain, and constipation. EXAM: UPPER GI SERIES WITHOUT KUB TECHNIQUE: Routine upper GI series was performed with thin/high density/water soluble barium. Effervescent crystals  and a barium tablet were administered. FLUOROSCOPY TIME:  Fluoroscopy Time:  1 minutes, 54 seconds Radiation Exposure Index (if provided by the fluoroscopic device): 30.7 mGy Number of Acquired Spot Images: 20 3+1 video loop. COMPARISON:  None. FINDINGS: The patient ingested thick and thin barium and the gas-forming crystals without difficulty. The cervical esophagus distended well. There is prominence of the prevertebral soft tissues on the lateral view from C4 to T1. This produces a mild impression upon the posterior aspect of the cervical esophagus. The thoracic esophagus distends well. Prominent tertiary contractions are observed  intermittently. There was a moderate-sized incompletely reducible hiatal hernia. There is transient narrowing at the GE junction. Here the barium pill hung but then passed into the hiatal hernia and into the stomach with an additional sip of water. A small amount of gastroesophageal reflux was observed. The stomach distended well. The mucosal fold pattern was normal. No ulcer niche was demonstrated. Gastric emptying was prompt. The duodenal bulb and C sweep were normal in appearance and position. IMPRESSION: Prominence of the prevertebral soft tissues producing broad-based mass effect upon the posterior wall of the esophagus without evidence of significant narrowing. A cricopharyngeus muscle impression was observed transiently. This broad-based mass-effect may be related to the chronic mild reversal of the normal cervical lordosis secondary to multi level cervical degenerative disc disease. However, CT scanning of the neck would be useful to exclude an occult mass here. 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 There was a moderate-sized incompletely reducible hiatal hernia without evidence of fixed stricture. No mucosal changes of esophagitis were demonstrated. The stomach and duodenum were normal in appearance for age. Electronically Signed   By: David  Martinique M.D.   On: 12/03/2017 10:14   Dg Ugi W/o Kub  Result Date: 12/03/2017 CLINICAL DATA:  Many years of dysphagia, epigastric pain, and constipation. EXAM: UPPER GI SERIES WITHOUT KUB TECHNIQUE: Routine upper GI series was performed with thin/high density/water soluble barium. Effervescent crystals and a barium tablet were administered. FLUOROSCOPY TIME:  Fluoroscopy Time:  1 minutes, 54 seconds Radiation Exposure Index (if provided by the fluoroscopic device): 30.7 mGy Number of Acquired Spot Images: 20 3+1 video loop. COMPARISON:  None. FINDINGS: The patient ingested thick and thin barium and the gas-forming crystals  without difficulty. The cervical esophagus distended well. There is prominence of the prevertebral soft tissues on the lateral view from C4 to T1. This produces a mild impression upon the posterior aspect of the cervical esophagus. The thoracic esophagus distends well. Prominent tertiary contractions are observed intermittently. There was a moderate-sized incompletely reducible hiatal hernia. There is transient narrowing at the GE junction. Here the barium pill hung but then passed into the hiatal hernia and into the stomach with an additional sip of water. A small amount of gastroesophageal reflux was observed. The stomach distended well. The mucosal fold pattern was normal. No ulcer niche was demonstrated. Gastric emptying was prompt. The duodenal bulb and C sweep were normal in appearance and position. IMPRESSION: Prominence of the prevertebral soft tissues producing broad-based mass effect upon the posterior wall of the esophagus without evidence of significant narrowing. A cricopharyngeus muscle impression was observed transiently. This broad-based mass-effect may be related to the chronic mild reversal of the normal cervical lordosis secondary to multi level cervical degenerative disc disease. However, CT scanning of the neck would be useful to exclude an occult mass here. 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 There was a moderate-sized incompletely reducible hiatal hernia without  evidence of fixed stricture. No mucosal changes of esophagitis were demonstrated. The stomach and duodenum were normal in appearance for age. Electronically Signed   By: David  Martinique M.D.   On: 12/03/2017 10:14    ASSESSMENT & PLAN:   Anemia, chronic disease # Chronic Anemia- mild Hb 12.;  At least since 2015.  Normal white count/platelets.  Patient is fairly asymptomatic.  #Discussed with the patient possible etiology of low-grade MDS; however given his age and the fact that he is  quite asymptomatic-I would not recommend a bone marrow biopsy at this time.   #Discussed with the patient that if his hemoglobin drops below 10/or he gets symptomatic; I would commend further work-up with a bone marrow biopsy.  If patient is diagnosed with MDS-clinically low-grade; patient might benefit from Aranesp.  Patient is not a candidate for any aggressive therapies.  Patient understands.  #Patient preference/I think reasonable to patient to follow-up with PCP; and follow-up with me if hemoglobin drops below 10 or patient gets symptomatic.  Thank you, Dawson Bills  for allowing me to participate in the care of your pleasant patient. Please do not hesitate to contact me with questions or concerns in the interim.  # 45 minutes face-to-face with the patient discussing the above plan of care; more than 50% of time spent on prognosis/ natural history; counseling and coordination.  Cc;Dr.Johnston/Kim Jerelene Redden   All questions were answered. The patient knows to call the clinic with any problems, questions or concerns.   Cammie Sickle, MD 12/16/2017 7:02 PM

## 2017-12-04 NOTE — Assessment & Plan Note (Addendum)
#  Chronic Anemia- mild Hb 12.;  At least since 2015.  Normal white count/platelets.  Patient is fairly asymptomatic.  #Discussed with the patient possible etiology of low-grade MDS; however given his age and the fact that he is quite asymptomatic-I would not recommend a bone marrow biopsy at this time.   #Discussed with the patient that if his hemoglobin drops below 10/or he gets symptomatic; I would commend further work-up with a bone marrow biopsy.  If patient is diagnosed with MDS-clinically low-grade; patient might benefit from Aranesp.  Patient is not a candidate for any aggressive therapies.  Patient understands.  #Patient preference/I think reasonable to patient to follow-up with PCP; and follow-up with me if hemoglobin drops below 10 or patient gets symptomatic.  Thank you, Brad Brady  for allowing me to participate in the care of your pleasant patient. Please do not hesitate to contact me with questions or concerns in the interim.  # 45 minutes face-to-face with the patient discussing the above plan of care; more than 50% of time spent on prognosis/ natural history; counseling and coordination.  Cc;Dr.Johnston/Kim Jerelene Redden

## 2018-01-06 ENCOUNTER — Ambulatory Visit: Payer: Self-pay | Admitting: Urology

## 2018-01-28 ENCOUNTER — Other Ambulatory Visit: Payer: Self-pay | Admitting: Neurological Surgery

## 2018-01-28 DIAGNOSIS — R2689 Other abnormalities of gait and mobility: Secondary | ICD-10-CM

## 2018-01-31 ENCOUNTER — Ambulatory Visit
Admission: RE | Admit: 2018-01-31 | Discharge: 2018-01-31 | Disposition: A | Discharge status: Home / Self Care | Payer: Medicare Other | Source: Ambulatory Visit | Attending: Neurological Surgery | Admitting: Neurological Surgery

## 2018-01-31 DIAGNOSIS — R2689 Other abnormalities of gait and mobility: Secondary | ICD-10-CM | POA: Diagnosis present

## 2018-01-31 DIAGNOSIS — I6523 Occlusion and stenosis of bilateral carotid arteries: Secondary | ICD-10-CM | POA: Insufficient documentation

## 2018-02-14 ENCOUNTER — Other Ambulatory Visit: Payer: Self-pay | Admitting: Neurological Surgery

## 2018-02-14 DIAGNOSIS — M47812 Spondylosis without myelopathy or radiculopathy, cervical region: Secondary | ICD-10-CM

## 2018-03-02 ENCOUNTER — Ambulatory Visit
Admission: RE | Admit: 2018-03-02 | Discharge: 2018-03-02 | Disposition: A | Discharge status: Home / Self Care | Payer: Medicare Other | Source: Ambulatory Visit | Attending: Neurological Surgery | Admitting: Neurological Surgery

## 2018-03-02 DIAGNOSIS — M47812 Spondylosis without myelopathy or radiculopathy, cervical region: Secondary | ICD-10-CM | POA: Diagnosis present

## 2018-07-15 ENCOUNTER — Other Ambulatory Visit: Payer: Self-pay

## 2018-07-15 ENCOUNTER — Encounter: Payer: Self-pay | Admitting: General Surgery

## 2018-07-15 ENCOUNTER — Ambulatory Visit (INDEPENDENT_AMBULATORY_CARE_PROVIDER_SITE_OTHER): Payer: Medicare Other | Admitting: General Surgery

## 2018-07-15 VITALS — BP 138/81 | HR 69 | Temp 97.8°F | Ht 68.0 in | Wt 175.0 lb

## 2018-07-15 DIAGNOSIS — K4090 Unilateral inguinal hernia, without obstruction or gangrene, not specified as recurrent: Secondary | ICD-10-CM

## 2018-07-15 NOTE — Patient Instructions (Addendum)
The patient is aware to call back for any questions or new concerns.  He will call back to schedule surgery  Inguinal Hernia, Adult An inguinal hernia is when fat or your intestines push through a weak spot in a muscle where your leg meets your lower belly (groin). This causes a rounded lump (bulge). This kind of hernia could also be:  In your scrotum, if you are male.  In folds of skin around your vagina, if you are male. There are three types of inguinal hernias. These include:  Hernias that can be pushed back into the belly (are reducible). This type rarely causes pain.  Hernias that cannot be pushed back into the belly (are incarcerated).  Hernias that cannot be pushed back into the belly and lose their blood supply (are strangulated). This type needs emergency surgery. If you do not have symptoms, you may not need treatment. If you have symptoms or a large hernia, you may need surgery. Follow these instructions at home: Lifestyle  Do these things if told by your doctor so you do not have trouble pooping (constipation): ? Drink enough fluid to keep your pee (urine) pale yellow. ? Eat foods that have a lot of fiber. These include fresh fruits and vegetables, whole grains, and beans. ? Limit foods that are high in fat and processed sugars. These include foods that are fried or sweet. ? Take medicine for trouble pooping.  Avoid lifting heavy objects.  Avoid standing for long amounts of time.  Do not use any products that contain nicotine or tobacco. These include cigarettes and e-cigarettes. If you need help quitting, ask your doctor.  Stay at a healthy weight. General instructions  You may try to push your hernia in by very gently pressing on it when you are lying down. Do not try to force the bulge back in if it will not push in easily.  Watch your hernia for any changes in shape, size, or color. Tell your doctor if you see any changes.  Take over-the-counter and  prescription medicines only as told by your doctor.  Keep all follow-up visits as told by your doctor. This is important. Contact a doctor if:  You have a fever.  You have new symptoms.  Your symptoms get worse. Get help right away if:  The area where your leg meets your lower belly has: ? Pain that gets worse suddenly. ? A bulge that gets bigger suddenly, and it does not get smaller after that. ? A bulge that turns red or purple. ? A bulge that is painful when you touch it.  You are a man, and your scrotum: ? Suddenly feels painful. ? Suddenly changes in size.  You cannot push the hernia in by very gently pressing on it when you are lying down. Do not try to force the bulge back in if it will not push in easily.  You feel sick to your stomach (nauseous), and that feeling does not go away.  You throw up (vomit), and that keeps happening.  You have a fast heartbeat.  You cannot poop (have a bowel movement) or pass gas. These symptoms may be an emergency. Do not wait to see if the symptoms will go away. Get medical help right away. Call your local emergency services (911 in the [place] Summary  An inguinal hernia is when fat or your intestines push through a weak spot in a muscle where your leg meets your lower belly (groin). This causes a rounded lump (  bulge).  If you do not have symptoms, you may not need treatment. If you have symptoms or a large hernia, you may need surgery.  Avoid lifting heavy objects. Also avoid standing for long amounts of time.  Do not try to force the bulge back in if it will not push in easily. This information is not intended to replace advice given to you by your health care provider. Make sure you discuss any questions you have with your health care provider. Document Released: 03/15/2006 Document Revised: 03/16/2017 Document Reviewed: 11/14/2016 Elsevier Interactive Patient Education  2019 [employer].

## 2018-07-15 NOTE — Progress Notes (Addendum)
Patient ID: Brad Brady, male   DOB: 11/30/1927, 85 y.o.   MRN: [id number]  Chief Complaint  Patient presents with  . Follow-up    HPI Brad Brady is a 85 y.o. male here today for a evaluation of a left inguinal hernia. Patient noticed this area about seven months ago. He does have a bulge in the left groin. He had this right inguinal hernia repair done in 1996 and thinks that the mesh has "a hole in it".  He has been aware of some discomfort in the right groin for the last 9 months or so, but has not seen any bulge on the right side.  The left side has become more pronounced over the last 6 months.  Lifelong constipation. He has a BM every 2-3 days with the use of Senakot and 2 "little blue pills".  (Ex-Lax from.  Previous trials of fiber supplements and MiraLAX have been unsuccessful.  He is retired Physicist, medical at [employer].  He did see Dr Windell Moment in October 2019.  HPI  Past Medical History:  Diagnosis Date  . Anemia   . BPH (benign prostatic hyperplasia)   . Coronary artery disease   . GERD (gastroesophageal reflux disease)   . 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 High cholesterol   . Hypertension   . IBS (irritable bowel syndrome)   . Thyroid disease   . Vitamin D deficiency     Past Surgical History:  Procedure Laterality Date  . CARDIAC CATHETERIZATION    . COLONOSCOPY    . COLONOSCOPY WITH PROPOFOL N/A 10/20/2014   Procedure: COLONOSCOPY WITH PROPOFOL;  Surgeon: Manya Silvas, MD;  Location: [facility] ENDOSCOPY;  Service: Endoscopy;  Laterality: N/A;  . ESOPHAGOGASTRODUODENOSCOPY (EGD) WITH PROPOFOL N/A 10/20/2014   Procedure: ESOPHAGOGASTRODUODENOSCOPY (EGD) WITH PROPOFOL;  Surgeon: Manya Silvas, MD;  Location: [facility] ENDOSCOPY;  Service: Endoscopy;  Laterality: N/A;  . HERNIA REPAIR Right 1996  . PTCA of PDA      History reviewed. No pertinent family history.  Social History Social History   Tobacco Use  . Smoking status: Never Smoker  .  Smokeless tobacco: Never Used  Substance Use Topics  . Alcohol use: No  . Drug use: No    No Known Allergies  Current Outpatient Medications  Medication Sig Dispense Refill  . acetaminophen (TYLENOL) 500 MG tablet Take 500 mg by mouth every 6 (six) hours as needed for moderate pain.     [name] Kitchen aspirin EC 81 MG tablet Take 81 mg by mouth every other day.    [name] Kitchen atenolol (TENORMIN) 25 MG tablet Take 1 tablet (25 mg total) by mouth daily. 30 tablet 0  . atorvastatin (LIPITOR) 20 MG tablet Take 20 mg by mouth at bedtime.    . Cholecalciferol (VITAMIN D3) 2000 UNITS capsule Take 2,000 Units by mouth every morning.    . dutasteride (AVODART) 0.5 MG capsule Take 0.5 mg by mouth daily.    [name] Kitchen esomeprazole (NEXIUM) 40 MG capsule Take 40 mg by mouth daily as needed (for acid reflux.).     [name] Kitchen Ferrous Sulfate (IRON) 325 (65 Fe) MG TABS Take 1 tablet by mouth every other day.    . Multiple Vitamins-Minerals (PRESERVISION/LUTEIN PO) Take 1 tablet by mouth daily.    [name] Kitchen senna-docusate (SENOKOT-S) 8.6-50 MG per tablet Take 2 tablets by mouth at bedtime as needed for mild constipation. 30 tablet 0  . SYNTHROID 112 MCG tablet Take 112 mcg by mouth at bedtime.     [name] Kitchen  tamsulosin (FLOMAX) 0.4 MG CAPS capsule Take 0.4 mg by mouth daily.    . vitamin B-12 (CYANOCOBALAMIN) 1000 MCG tablet Take 1,000 mcg by mouth daily.      No current facility-administered medications for this visit.     Review of Systems Review of Systems  Constitutional: Negative.   Respiratory: Negative.   Cardiovascular: Negative.     Blood pressure 138/81, pulse 69, temperature 97.8 F (36.6 C), temperature source Skin, height 5\' 8"  (1.727 m), weight 175 lb (79.4 kg), SpO2 97 %.  Physical Exam Physical Exam Exam conducted with a chaperone present.  Constitutional:      Appearance: He is well-developed.  HENT:     Mouth/Throat:     Pharynx: No oropharyngeal exudate.  Eyes:     General: No scleral icterus.    Conjunctiva/sclera:  Conjunctivae normal.  Neck:     Musculoskeletal: Neck supple.  Cardiovascular:     Rate and Rhythm: Normal rate and regular rhythm.     Heart sounds: Normal heart sounds.  Pulmonary:     Effort: Pulmonary effort is normal.     Breath sounds: Normal breath sounds.  Abdominal:     General: Bowel sounds are normal.     Palpations: Abdomen is soft.     Hernia: A hernia is present. Hernia is present in the left inguinal area. There is no hernia in the right inguinal area.     Comments: reducible left inguinal hernia   Genitourinary:   Skin:    General: Skin is warm and dry.  Neurological:     Mental Status: He is alert and oriented to person, place, and time.  Psychiatric:        Behavior: Behavior normal.     Data Reviewed Surgery evaluation completed by Tish Frederickson, MD on December 20, 2017 reviewed.  Small asymptomatic left inguinal hernia described.  Right side normal. CT of the abdomen and pelvis dated November 21, 2017 personally reviewed. 1. Tiny residual or recurrent fat containing left inguinal hernia. 2. Apparent bladder wall thickening, with adjacent pericystic fat stranding. The wall thickening is favored to be due to underdistention. The pericystic fat stranding is favored to be postoperative. Consider correlation with urinalysis to exclude cystitis. 3.  Aortic Atherosclerosis (ICD10-I70.0). 4. Bilateral hydroceles.  Assessment Increased size and symptoms related to left inguinal hernia.  Plan The patient is primary caregiver for his wife who has some health issues.  He will look at his schedule and see when family assistance would be present to assist with her care.  He is aware at this time elective surgery is again not being scheduled.  He was encouraged to call should he develop symptoms.  The patient is aware to call back for any questions or new concerns. He will call back to schedule surgery.  Hernia precautions and incarceration were  discussed with the patient. If they develop symptoms of an incarcerated hernia, they were encouraged to seek prompt medical attention.  I have recommended repair of the hernia using mesh on an outpatient basis in the near future. The risk of infection was reviewed. The role of prosthetic mesh to minimize the risk of recurrence was reviewed.    HPI, Physical Exam, Assessment and Plan have been scribed under the direction and in the presence of Hervey Ard, MD.  Gaspar Cola, CMA HPI, assessment, plan and physical exam has been scribed under the direction and in the presence of Robert Bellow, MD. Karie Fetch, RN  I have  completed the exam and reviewed the above documentation for accuracy and completeness.  I agree with the above.  Haematologist has been used and any errors in dictation or transcription are unintentional.  Hervey Ard, M.D., F.A.C.S.   Forest Gleason Pressley Barsky 07/16/2018, 2:19 PM

## 2018-07-16 DIAGNOSIS — K4090 Unilateral inguinal hernia, without obstruction or gangrene, not specified as recurrent: Secondary | ICD-10-CM | POA: Insufficient documentation

## 2018-09-01 ENCOUNTER — Encounter: Payer: Self-pay | Admitting: Emergency Medicine

## 2018-09-01 ENCOUNTER — Emergency Department
Admission: EM | Admit: 2018-09-01 | Discharge: 2018-09-01 | Disposition: A | Discharge status: Home / Self Care | Payer: Medicare Other | Attending: Emergency Medicine | Admitting: Emergency Medicine

## 2018-09-01 ENCOUNTER — Emergency Department: Payer: Medicare Other

## 2018-09-01 ENCOUNTER — Other Ambulatory Visit: Payer: Self-pay

## 2018-09-01 DIAGNOSIS — Z7982 Long term (current) use of aspirin: Secondary | ICD-10-CM | POA: Diagnosis not present

## 2018-09-01 DIAGNOSIS — R079 Chest pain, unspecified: Secondary | ICD-10-CM

## 2018-09-01 DIAGNOSIS — R109 Unspecified abdominal pain: Secondary | ICD-10-CM | POA: Diagnosis present

## 2018-09-01 DIAGNOSIS — Z79899 Other long term (current) drug therapy: Secondary | ICD-10-CM | POA: Diagnosis not present

## 2018-09-01 DIAGNOSIS — I10 Essential (primary) hypertension: Secondary | ICD-10-CM | POA: Insufficient documentation

## 2018-09-01 DIAGNOSIS — I2510 Atherosclerotic heart disease of native coronary artery without angina pectoris: Secondary | ICD-10-CM | POA: Insufficient documentation

## 2018-09-01 LAB — CBC
HCT: 36.8 % — ABNORMAL LOW (ref 39.0–52.0)
Hemoglobin: 12.5 g/dL — ABNORMAL LOW (ref 13.0–17.0)
MCH: 32.6 pg (ref 26.0–34.0)
MCHC: 34 g/dL (ref 30.0–36.0)
MCV: 95.8 fL (ref 80.0–100.0)
Platelets: 156 10*3/uL (ref 150–400)
RBC: 3.84 MIL/uL — ABNORMAL LOW (ref 4.22–5.81)
RDW: 13.4 % (ref 11.5–15.5)
WBC: 7.2 10*3/uL (ref 4.0–10.5)
nRBC: 0 % (ref 0.0–0.2)

## 2018-09-01 LAB — BASIC METABOLIC PANEL
Anion gap: 9 (ref 5–15)
BUN: 21 mg/dL (ref 8–23)
CO2: 24 mmol/L (ref 22–32)
Calcium: 8.8 mg/dL — ABNORMAL LOW (ref 8.9–10.3)
Chloride: 102 mmol/L (ref 98–111)
Creatinine, Ser: 0.95 mg/dL (ref 0.61–1.24)
GFR calc Af Amer: >60 mL/min (ref >60)
GFR calc non Af Amer: >60 mL/min (ref >60)
Glucose, Bld: 102 mg/dL — ABNORMAL HIGH (ref 70–99)
Potassium: 3.7 mmol/L (ref 3.5–5.1)
Sodium: 135 mmol/L (ref 135–145)

## 2018-09-01 LAB — HEPATIC FUNCTION PANEL
ALT: 24 U/L (ref 0–44)
AST: 33 U/L (ref 15–41)
Albumin: 4.2 g/dL (ref 3.5–5.0)
Alkaline Phosphatase: 52 U/L (ref 38–126)
Bilirubin, Direct: 0.1 mg/dL (ref 0.0–0.2)
Indirect Bilirubin: 0.7 mg/dL (ref 0.3–0.9)
Total Bilirubin: 0.8 mg/dL (ref 0.3–1.2)
Total Protein: 6.9 g/dL (ref 6.5–8.1)

## 2018-09-01 LAB — TROPONIN I (HIGH SENSITIVITY)
Troponin I (High Sensitivity): 13 ng/L (ref <18)
Troponin I (High Sensitivity): 15 ng/L (ref <18)
Troponin I (High Sensitivity): 17 ng/L (ref <18)

## 2018-09-01 LAB — LIPASE, BLOOD: Lipase: 29 U/L (ref 11–51)

## 2018-09-01 MED ORDER — IOHEXOL 300 MG/ML  SOLN
100.0000 mL | Freq: Once | INTRAMUSCULAR | Status: AC | PRN
  Administered 2018-09-01: 16:00:00 100 mL via INTRAVENOUS

## 2018-09-01 MED ORDER — IOHEXOL 240 MG/ML SOLN
50.0000 mL | Freq: Once | INTRAMUSCULAR | Status: AC | PRN
  Administered 2018-09-01: 50 mL via ORAL

## 2018-09-01 NOTE — ED Notes (Signed)
CT called and informed that pt was done with contrast drink

## 2018-09-01 NOTE — ED Notes (Signed)
2nd troponin sent to lab.

## 2018-09-01 NOTE — ED Triage Notes (Signed)
Patient reports an episode of suddenly feeling hot/sweaty and weak around 10:30am.  Patient states he was "checking on a rental property, punch list, outside." States, "I wasn't doing anything that took any real energy."    Patient states since that time he has noticed his heart being "out of rhythm" and has felt confused.  Patient states, "I just haven't felt good.  Patient denies any chest heaviness.  Patient states chest feels, "strange and different" and radiates into abdomen.

## 2018-09-01 NOTE — ED Notes (Signed)
3rd HS troponin sent to lab

## 2018-09-01 NOTE — ED Notes (Signed)
Esign not working at this time. Pt verbalized discharge instructions and has no questions at this time. 

## 2018-09-01 NOTE — ED Notes (Signed)
Patient took 2, 81mg  aspirin at home prior to arrival.

## 2018-09-01 NOTE — ED Provider Notes (Signed)
[facility] Emergency Department Provider Note   ____________________________________________   First MD Initiated Contact with Patient 09/01/18 1449     (approximate)  I have reviewed the triage vital signs and the nursing notes.   HISTORY  Chief Complaint Chest Pain    HPI Brad Brady is a 85 y.o. male who reports he was outside in the heat for about 10 minutes and had an episode of some discomfort in his abdomen a little bit in his chest.  He also had some something like nausea with it.  He was not short of breath.  This discomfort lasted for some time he and after he went back inside and laid down.  He cannot describe it is any sharp or stabbing pain or tightness is just felt very bad and nothing he is ever had before.  Nothing seemed to make it better or worse and lasted as I understand for several hours and then went away.  He was sweaty outside but not once he went inside.  He did not have any shortness of breath with it.  Really does not sound like it felt like his cardiac symptoms.        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   Iliac artery aneurysm ([place]) 10/02/2016   Unstable angina ([place]) 09/05/2014   ASCVD (arteriosclerotic cardiovascular disease) 09/05/2014   Abnormal EKG 09/05/2014    Past Surgical History:  Procedure Laterality Date   CARDIAC CATHETERIZATION     COLONOSCOPY     COLONOSCOPY  WITH PROPOFOL N/A 10/20/2014   Procedure: COLONOSCOPY WITH PROPOFOL;  Surgeon: Manya Silvas, MD;  Location: [facility] ENDOSCOPY;  Service: Endoscopy;  Laterality: N/A;   ESOPHAGOGASTRODUODENOSCOPY (EGD) WITH PROPOFOL N/A 10/20/2014   Procedure: ESOPHAGOGASTRODUODENOSCOPY (EGD) WITH PROPOFOL;  Surgeon: Manya Silvas, MD;  Location: [facility] ENDOSCOPY;  Service: Endoscopy;  Laterality: N/A;   HERNIA REPAIR Right 1996   PTCA of PDA      Prior to Admission medications   Medication Sig Start Date End Date Taking? Authorizing Provider  aspirin EC 81 MG tablet Take 81 mg by mouth every other day.   Yes [provider]  atenolol (TENORMIN) 25 MG tablet Take 1 tablet (25 mg total) by mouth daily. 09/07/14  Yes Max Sane, MD  atorvastatin (LIPITOR) 20 MG tablet Take 20 mg by mouth at bedtime. 06/03/14  Yes [provider]  Cholecalciferol (VITAMIN D3) 2000 UNITS capsule Take 2,000 Units by mouth every morning.   Yes [provider]  dutasteride (AVODART) 0.5 MG capsule Take 0.5 mg by mouth daily. 07/27/14  Yes [provider]  esomeprazole (NEXIUM) 40 MG capsule Take 40 mg by mouth daily as needed (for acid reflux.).  06/03/14  Yes [provider]  Ferrous Sulfate (IRON) 325 (65 Fe) MG TABS Take 1 tablet by mouth every other day.   Yes [provider]  Multiple Vitamins-Minerals (PRESERVISION/LUTEIN PO) Take 1 tablet by mouth daily.   Yes [provider]  SYNTHROID 125 MCG tablet Take 125 mcg by mouth daily. 07/03/18  Yes [provider]  tamsulosin (FLOMAX) 0.4 MG CAPS capsule Take 0.4 mg by mouth daily. 07/27/14  Yes [provider]  vitamin B-12 (CYANOCOBALAMIN) 1000 MCG tablet Take 1,000 mcg by mouth daily.    Yes [provider]  acetaminophen (TYLENOL) 500 MG tablet Take 500 mg by mouth every 6 (six) hours as needed for moderate pain.     [provider]  senna-docusate (SENOKOT-S) 8.6-50 MG per tablet Take 2  tablets by mouth at bedtime as needed for mild constipation. 09/07/14   Max Sane, MD    Allergies Patient has no known allergies.  History reviewed. No pertinent family history.  Social History Social History   Tobacco Use   Smoking status: Never Smoker   Smokeless tobacco: Never Used  Substance Use Topics   Alcohol use: No   Drug use: No    Review of Systems  Constitutional: No fever/chills Eyes: No visual changes. ENT: No sore throat. Cardiovascular: See HPI Respiratory: Denies shortness of breath. Gastrointestina see HPI Genitourinary: Negative for dysuria. Musculoskeletal: Negative for back pain. Skin: Negative for rash. Neurological: Negative for headaches, focal weakness   ____________________________________________   PHYSICAL EXAM:  VITAL SIGNS: ED Triage Vitals  Enc Vitals Group     BP 09/01/18 1352 (!) 152/87     Pulse Rate 09/01/18 1352 65     Resp 09/01/18 1352 16     Temp 09/01/18 1352 99 F (37.2 C)     Temp Source 09/01/18 1352 Oral     SpO2 09/01/18 1352 99 %     Weight 09/01/18 1352 170 lb (77.1 kg)     Height 09/01/18 1352 5\' 10"  (1.778 m)     Head Circumference --      Peak Flow --      Pain Score 09/01/18 1347 4     Pain Loc --      Pain Edu? --      Excl. in [place]? --     Constitutional: Alert and oriented. Well appearing and in no acute distress. Eyes: Conjunctivae are normal. PERRL. EOMI. Head: Atraumatic. Nose: No congestion/rhinnorhea. Mouth/Throat: Mucous membranes are moist.  Oropharynx non-erythematous. Neck: No stridor. Cardiovascular: Normal rate, regular rhythm. Grossly normal heart sounds.  Good peripheral circulation. Respiratory: Normal respiratory effort.  No retractions. Lungs CTAB. Gastrointestinal: Soft and nontender. No distention. No abdominal bruits. No CVA tenderness. Musculoskeletal: No lower extremity tenderness nor edema.   Neurologic:  Normal speech and language. No gross focal neurologic deficits are  appreciated. No gait instability. Skin: Age-related changes but no acute problems no rash  ____________________________________________   LABS (all labs ordered are listed, but only abnormal results are displayed)  Labs Reviewed  BASIC METABOLIC PANEL - Abnormal; Notable for the following components:      Result Value   Glucose, Bld 102 (*)    Calcium 8.8 (*)    All other components within normal limits  CBC - Abnormal; Notable for the following components:   RBC 3.84 (*)    Hemoglobin 12.5 (*)    HCT 36.8 (*)    All other components within normal limits  TROPONIN I (HIGH SENSITIVITY)  TROPONIN I (HIGH SENSITIVITY)  LIPASE, BLOOD  HEPATIC FUNCTION PANEL  TROPONIN I (HIGH SENSITIVITY)  TROPONIN I (HIGH SENSITIVITY)  TROPONIN I (HIGH SENSITIVITY)  TROPONIN  I (HIGH SENSITIVITY)   ____________________________________________  EKG  EKG #1 read interpreted by me shows normal sinus rhythm rate of 77 right axis right bundle branch block 2 PVCs similar to prior EKG EKG #2 read and interpreted by me shows normal sinus rhythm at 72 normal axis.  I should add that the first 1 computer read axis is 101 degrees and this was 89 degrees there are very similar in her axis to slightly changed.  Right bundle branch block similar to #1 also with 2 PVCs ____________________________________________  RADIOLOGY  ED MD interpretation: X-ray read by radiology reviewed by me shows no acute disease  Official radiology report(s): Dg Chest 2 View  Result Date: 09/01/2018 CLINICAL DATA:  Episode of diaphoresis and weakness at 10:30 a.m. today. EXAM: CHEST - 2 VIEW COMPARISON:  PA and lateral chest 06/21/2017 and 09/05/2014. FINDINGS: The lungs are clear. Heart size is normal. Aortic atherosclerosis is noted. No pneumothorax or pleural fluid. No acute or focal bony abnormality. IMPRESSION: No acute disease. Electronically Signed   By: Inge Rise M.D.   On: 09/01/2018 14:54   Ct Abdomen Pelvis W  Contrast  Result Date: 09/01/2018 CLINICAL DATA:  Generalized abdominal and pelvic pain and weakness. EXAM: CT ABDOMEN AND PELVIS WITH CONTRAST TECHNIQUE: Multidetector CT imaging of the abdomen and pelvis was performed using the standard protocol following bolus administration of intravenous contrast. CONTRAST:  170mL OMNIPAQUE IOHEXOL 300 MG/ML  SOLN COMPARISON:  11/21/2017 FINDINGS: Lower chest: A cluster of solid pulmonary nodules in the right lung base, the larger measuring 9 mm, image 12/27, sequence 6. Enlarged heart. Calcific atherosclerotic disease and tortuosity of the aorta. Small hiatal hernia. Hepatobiliary: Nodular contour of the liver. Normal appearance of the gallbladder. Pancreas: Unremarkable. No pancreatic ductal dilatation or surrounding inflammatory changes. Spleen: Normal in size without focal abnormality. Adrenals/Urinary Tract: Adrenal glands are unremarkable. Kidneys are normal, without renal calculi, focal lesion, or hydronephrosis. Bladder is unremarkable. Stomach/Bowel: Stomach is within normal limits. Appendix appears normal. No evidence of small bowel wall thickening, distention, or inflammatory changes. Diffuse left colonic diverticulosis. Wide neck left inguinal hernia contains parts of the sigmoid colon, without evidence of strangulation. Vascular/Lymphatic: Aortic atherosclerosis and tortuosity of the aorta. No enlarged abdominal or pelvic lymph nodes. Reproductive: Prostate is unremarkable. Bilateral hydrocele. Other: No abdominopelvic ascites. Musculoskeletal: Scoliosis and spondylosis of the lumbosacral spine. IMPRESSION: A cluster of solid pulmonary nodules in the right lung base, the larger measuring 9 mm. Non-contrast chest CT at 3-6 months is recommended. If the nodules are stable at time of repeat CT, then future CT at 18-24 months (from today's scan) is considered optional for low-risk patients, but is recommended for high-risk patients. This recommendation follows the  consensus statement: Guidelines for Management of Incidental Pulmonary Nodules Detected on CT Images: From the Fleischner Society 2017; Radiology 2017; 284:228-243. Wide neck left inguinal hernia contains parts of the sigmoid and descending colon. No evidence of strangulation. Bilateral hydrocele. Electronically Signed   By: Fidela Salisbury M.D.   On: 09/01/2018 16:23    ____________________________________________   PROCEDURES  Procedure(s) performed (including Critical Care):  Procedures   ____________________________________________   INITIAL IMPRESSION / ASSESSMENT AND PLAN / ED COURSE  Brad Brady was evaluated in Emergency Department on 09/01/2018 for the symptoms described in the history of present illness. He was evaluated in the context of the global COVID-19 pandemic, which necessitated consideration that the patient might be at risk for infection with the SARS-CoV-2 virus that causes COVID-19.  Institutional protocols and algorithms that pertain to the evaluation of patients at risk for COVID-19 are in a state of rapid change based on information released by regulatory bodies including the CDC and federal and state organizations. These policies and algorithms were followed during the patient's care in the ED.    I did send an email to the patient's primary care provider to have him follow-up with the patient on the need for the CT in 6 months to evaluate the nodules in the lung base       ____________________________________________   FINAL CLINICAL IMPRESSION(S) / ED DIAGNOSES  Final diagnoses:  Nonspecific chest pain     ED Discharge Orders    None       Note:  This document was prepared using Dragon voice recognition software and may include unintentional dictation errors.    Nena Polio, MD 09/01/18 2113

## 2018-09-01 NOTE — Discharge Instructions (Signed)
Please return if you get worse again.  Please take it easy for the next day or 2 and follow-up with Dr. Saralyn Pilar your cardiologist.  Give his office a call in the morning they should be out of see you tomorrow the next day.

## 2018-10-13 ENCOUNTER — Ambulatory Visit: Payer: Self-pay | Admitting: General Surgery

## 2018-10-13 NOTE — H&P (View-Only) (Signed)
PATIENT PROFILE: Brad Brady is a 85 y.o. male who presents to the Clinic for consultation at the request of Dr. Edwina Barth for evaluation of left inguinal hernia.  PCP:  Velna Ochs, MD  HISTORY OF PRESENT ILLNESS: Brad Brady reports having left inguinal hernia since at least a year ago.  Patient reports that he had right inguinal hernia repair in the 1990s.  He needed to surgeries for unexplained reason.  He later developed chronic right groin pain that was treated with injections.  It improved with injection but came back.  He denies any bulges on the right groin.  He had a CT scan that did not show any recurrence of the right eye.  I personally evaluated the images.  He does have a bulging on the left side.  He reports that the left inguinal hernia is getting more comfortable.  He reports that the pain does not radiate to the carotid body.  She reports that the pain is aggravated when he tried to stand from the sitting position.  He denies any alleviating factors.  He denies any abdominal distention.  He reports that he is able to reduce the hernia when he is lying down.  He reports tolerating diet having regular bowel movement.  He denies any chest pain.  He denies any shortness of breath.  He is very active.   PROBLEM LIST:         Problem List  Date Reviewed: 10/01/2018         Noted   Left inguinal hernia 12/01/2017   Discomfort of groin, right 12/01/2017   GERD (gastroesophageal reflux disease) 11/29/2017   Bladder wall thickening 11/29/2017   Chronic superficial gastritis without bleeding 11/10/2014   Abnormal electrocardiogram 09/05/2014   Hx of cardiac cath 12/29/2013   Overview    Status post PCI of PDA 08/24/92      AAA (abdominal aortic aneurysm) (CMS-HCC) 12/29/2013   Overview    Hx of AAA 3.29cm x 3.23cm on ultrasound of the aorta 04/01/07 followed by Dr Lucky Cowboy      Chronic back pain 12/29/2013   Hypertension 08/31/2013   CAD (coronary artery  disease) 08/31/2013   Hyperlipidemia 08/31/2013   Hypothyroidism 08/31/2013   Anemia 08/31/2013      GENERAL REVIEW OF SYSTEMS:   General ROS: negative for - chills, fatigue, fever, weight gain or weight loss Allergy and Immunology ROS: negative for - hives  Hematological and Lymphatic ROS: negative for - bleeding problems or bruising, negative for palpable nodes Endocrine ROS: negative for - heat or cold intolerance, hair changes Respiratory ROS: negative for - cough, shortness of breath or wheezing Cardiovascular ROS: no chest pain or palpitations GI ROS: negative for nausea, vomiting, abdominal pain, diarrhea, constipation Musculoskeletal ROS: negative for - joint swelling or muscle pain Neurological ROS: negative for - confusion, syncope Dermatological ROS: negative for pruritus and rash Psychiatric: negative for anxiety, depression, difficulty sleeping and memory loss  MEDICATIONS: Current Medications        Current Outpatient Medications  Medication Sig Dispense Refill  . acetaminophen (TYLENOL) 500 MG tablet Take 500 mg by mouth every 6 (six) hours as needed for Pain.    [name] Kitchen aspirin 81 MG EC tablet Take 81 mg by mouth as directed for Pain (take one tablet every other day).    [name] Kitchen atenoloL (TENORMIN) 25 MG tablet Take 25 mg by mouth once daily Take 1/2 tablet daily      . atorvastatin (LIPITOR) 20 MG tablet  TAKE ONE TABLET EVERY DAY 90 tablet 2  . chlorhexidine (PERIDEX) 0.12 % solution     . cholecalciferol (VITAMIN D3) 2,000 unit capsule Take 2,000 Units by mouth once daily.    . clobetasol (CORMAX) 0.05 % external solution Reported on 06/29/2015     . cyanocobalamin (VITAMIN B-12) 1000 MCG tablet Take 1,000 mcg by mouth once daily.    [name] Kitchen dutasteride (AVODART) 0.5 mg capsule Take by mouth.    . dutasteride (AVODART) 0.5 mg capsule     . esomeprazole (NEXIUM) 40 MG DR capsule TAKE 1 CAPSULE BY MOUTH EVERY DAY 90 capsule 2  . ferrous sulfate 325 (65 FE) MG  tablet Take 65 mg by mouth every other day    . LIPITOR 20 mg tablet     . naproxen (NAPROSYN) 250 MG tablet     . polyethylene glycol (MIRALAX) packet Take 17 g by mouth once daily. Mix in 4-8ounces of fluid prior to taking.    [name] Kitchen SYNTHROID 125 mcg tablet Take 1 tablet (125 mcg total) by mouth once daily Take on an empty stomach with a glass of water at least 30-60 minutes before breakfast. 90 tablet 3  . tamsulosin (FLOMAX) 0.4 mg capsule Take 0.4 mg by mouth once daily. Take 30 minutes after same meal each day.    [name] Kitchen VIT C/VITE AC/LUT/COPPER/ZNOX (PRESERVISION LUTEIN ORAL) Take by mouth.     No current facility-administered medications for this visit.       ALLERGIES: Patient has no known allergies.  PAST MEDICAL HISTORY:     Past Medical History:  Diagnosis Date  . AAA (abdominal aortic aneurysm) (CMS-HCC)   . Allergic state   . Anemia   . Bladder wall thickening 11/29/2017  . CAD (coronary artery disease)   . Chest pain on exertion, unspecified 11/29/2017  . Chronic superficial gastritis without bleeding 11/10/2014  . Discomfort of groin, right 12/01/2017  . GERD (gastroesophageal reflux disease)   . 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 Hyperlipidemia   . Hypertension   . Hypothyroidism   . IBS (irritable bowel syndrome)   . Ileal erosions 11/10/2014  . Left inguinal hernia 12/01/2017  . Ruptured lumbar disc    Neurosurgery evaluation, epidural injections, not a surgical candidate due to back being severely damanged.  . Vitamin D deficiency     PAST SURGICAL HISTORY:      Past Surgical History:  Procedure Laterality Date  . CAPSULE ENDOSCOPY  10/2013  . COLONOSCOPY  06/26/1996   Diverticulosis  . COLONOSCOPY  08/21/2005   Tubulovillous Adenoma  . COLONOSCOPY  01/04/2009   Adenomatous Polyp: No repeat due to age per RTE  . COLONOSCOPY  10/20/2014   PH Adenomatous Polyps: No repeat due to age per RTE   . EGD   08/21/2005, 06/26/1996, 01/17/1988  . EGD  10/20/2014   No repeat per RTE  . HERNIA REPAIR    . PTCA  08/24/92   PTCA of PDA     FAMILY HISTORY:      Family History  Problem Relation Age of Onset  . Heart disease Brother   . No Known Problems Mother   . No Known Problems Father      SOCIAL HISTORY: Social History          Socioeconomic History  . Marital status: Married    Spouse name: Not on file  . Number of children: Not on file  . Years of education: Not on file  .  Highest education level: Not on file  Occupational History  . Not on file  Social Needs  . Financial resource strain: Not on file  . Food insecurity:    Worry: Not on file    Inability: Not on file  . Transportation needs:    Medical: Not on file    Non-medical: Not on file  Tobacco Use  . Smoking status: Never Smoker  . Smokeless tobacco: Never Used  Substance and Sexual Activity  . Alcohol use: No    Alcohol/week: 0.0 standard drinks  . Drug use: No  . Sexual activity: Defer  Other Topics Concern  . Not on file  Social History Narrative  . Not on file      PHYSICAL EXAM:  GENERAL: Alert, active, oriented x3  HEENT: Pupils equal reactive to light. Extraocular movements are intact. Sclera clear. Palpebral conjunctiva normal red color.Pharynx clear.  NECK: Supple with no palpable mass and no adenopathy.  LUNGS: Sound clear with no rales rhonchi or wheezes.  HEART: Regular rhythm S1 and S2 without murmur.  ABDOMEN: Soft and depressible, nontender with no palpable mass, no hepatomegaly.  Left inguinal hernia, reducible, mild to moderate tenderness to palpation of reduction.  There is no palpable hernia on the right side.  Patient was evaluated in the sitting and standing position and with Valsalva maneuver and no hernia was appreciated on the right groin.  EXTREMITIES: Well-developed well-nourished symmetrical with no dependent edema.  NEUROLOGICAL: Awake  alert oriented, facial expression symmetrical, moving all extremities.  REVIEW OF DATA: I have reviewed the following data today:      Appointment on 09/24/2018  Component Date Value  . Thyroid Stimulating Horm* 09/24/2018 2.001   . Glucose 09/24/2018 83   . Sodium 09/24/2018 140   . Potassium 09/24/2018 4.2   . Chloride 09/24/2018 104   . Carbon Dioxide (CO2) 09/24/2018 30.4   . Urea Nitrogen (BUN) 09/24/2018 11   . Creatinine 09/24/2018 0.9   . Glomerular Filtration Ra* 09/24/2018 80   . Calcium 09/24/2018 8.7   . AST  09/24/2018 26   . ALT  09/24/2018 21   . Alk Phos (alkaline Phosp* 09/24/2018 46   . Albumin 09/24/2018 3.8   . Bilirubin, Total 09/24/2018 0.6   . Protein, Total 09/24/2018 5.7*  . A/G Ratio 09/24/2018 2.0   . WBC (White Blood Cell Co* 09/24/2018 4.9   . RBC (Red Blood Cell Coun* 09/24/2018 3.67*  . Hemoglobin 09/24/2018 12.2*  . Hematocrit 09/24/2018 36.1*  . MCV (Mean Corpuscular Vo* 09/24/2018 98.4   . MCH (Mean Corpuscular He* 09/24/2018 33.2*  . MCHC (Mean Corpuscular H* 09/24/2018 33.8   . Platelet Count 09/24/2018 143*  . RDW-CV (Red Cell Distrib* 09/24/2018 13.5   . MPV (Mean Platelet Volum* 09/24/2018 10.8   . Neutrophils 09/24/2018 2.75   . Lymphocytes 09/24/2018 1.29   . Monocytes 09/24/2018 0.57   . Eosinophils 09/24/2018 0.23   . Basophils 09/24/2018 0.04   . Neutrophil % 09/24/2018 56.2   . Lymphocyte % 09/24/2018 26.4   . Monocyte % 09/24/2018 11.7   . Eosinophil % 09/24/2018 4.7   . Basophil% 09/24/2018 0.8   . Immature Granulocyte % 09/24/2018 0.2   . Immature Granulocyte Cou* 09/24/2018 0.01      ASSESSMENT: Brad Brady is a 85 y.o. male presenting for consultation for left inguinal hernia.    The patient presents with a symptomatic, reducible inguinal hernia. Patient was oriented about the diagnosis of inguinal hernia  and its implication. The patient was oriented about the treatment alternatives (observation vs surgical repair).  Due to patient symptoms, repair is recommended. Patient oriented about the surgical procedure, the use of mesh and its risk of complications such as: infection, bleeding, injury to vas deference, vasculature and testicle, injury to bowel or bladder, and chronic pain.  Medical with the patient that I cannot guarantee that the pain in the right groin will improve with the repair of the left inguinal hernia.  Since he is symptomatic on the left inguinal hernia I would recommend to proceed with left inguinal hernia repair.  Continue evaluation of the right groin pain.  He might need repeat injections in the right groin area.  Non-recurrent unilateral inguinal hernia without obstruction or gangrene [K40.90]  PLAN: 1. Left inguinal hernia repair with mesh (71245) 2. CBC, CMP done 09/24/2018 3. Internal Medicine clearance by Dr. Edwina Barth 4. Stop taking aspirin 5 days before procedure 5. Contact us if has any question or concern.   Patient verbalized understanding, all questions were answered, and were agreeable with the plan outlined above.   This encounter was more than 40 minutes, more than 50% of the time counseling the patient and coordinating plan of care.  Herbert Pun, MD  Electronically signed by Herbert Pun, MD

## 2018-10-13 NOTE — H&P (Signed)
PATIENT PROFILE: Brad Brady is a 85 y.o. male who presents to the Clinic for consultation at the request of Dr. Edwina Barth for evaluation of left inguinal hernia.  PCP:  Velna Ochs, MD  HISTORY OF PRESENT ILLNESS: Brad Brady reports having left inguinal hernia since at least a year ago.  Patient reports that he had right inguinal hernia repair in the 1990s.  He needed to surgeries for unexplained reason.  He later developed chronic right groin pain that was treated with injections.  It improved with injection but came back.  He denies any bulges on the right groin.  He had a CT scan that did not show any recurrence of the right eye.  I personally evaluated the images.  He does have a bulging on the left side.  He reports that the left inguinal hernia is getting more comfortable.  He reports that the pain does not radiate to the carotid body.  She reports that the pain is aggravated when he tried to stand from the sitting position.  He denies any alleviating factors.  He denies any abdominal distention.  He reports that he is able to reduce the hernia when he is lying down.  He reports tolerating diet having regular bowel movement.  He denies any chest pain.  He denies any shortness of breath.  He is very active.   PROBLEM LIST:         Problem List  Date Reviewed: 10/01/2018         Noted   Left inguinal hernia 12/01/2017   Discomfort of groin, right 12/01/2017   GERD (gastroesophageal reflux disease) 11/29/2017   Bladder wall thickening 11/29/2017   Chronic superficial gastritis without bleeding 11/10/2014   Abnormal electrocardiogram 09/05/2014   Hx of cardiac cath 12/29/2013   Overview    Status post PCI of PDA 08/24/92      AAA (abdominal aortic aneurysm) (CMS-HCC) 12/29/2013   Overview    Hx of AAA 3.29cm x 3.23cm on ultrasound of the aorta 04/01/07 followed by Dr Lucky Cowboy      Chronic back pain 12/29/2013   Hypertension 08/31/2013   CAD (coronary artery  disease) 08/31/2013   Hyperlipidemia 08/31/2013   Hypothyroidism 08/31/2013   Anemia 08/31/2013      GENERAL REVIEW OF SYSTEMS:   General ROS: negative for - chills, fatigue, fever, weight gain or weight loss Allergy and Immunology ROS: negative for - hives  Hematological and Lymphatic ROS: negative for - bleeding problems or bruising, negative for palpable nodes Endocrine ROS: negative for - heat or cold intolerance, hair changes Respiratory ROS: negative for - cough, shortness of breath or wheezing Cardiovascular ROS: no chest pain or palpitations GI ROS: negative for nausea, vomiting, abdominal pain, diarrhea, constipation Musculoskeletal ROS: negative for - joint swelling or muscle pain Neurological ROS: negative for - confusion, syncope Dermatological ROS: negative for pruritus and rash Psychiatric: negative for anxiety, depression, difficulty sleeping and memory loss  MEDICATIONS: Current Medications        Current Outpatient Medications  Medication Sig Dispense Refill  . acetaminophen (TYLENOL) 500 MG tablet Take 500 mg by mouth every 6 (six) hours as needed for Pain.    [name] Kitchen aspirin 81 MG EC tablet Take 81 mg by mouth as directed for Pain (take one tablet every other day).    [name] Kitchen atenoloL (TENORMIN) 25 MG tablet Take 25 mg by mouth once daily Take 1/2 tablet daily      . atorvastatin (LIPITOR) 20 MG tablet  TAKE ONE TABLET EVERY DAY 90 tablet 2  . chlorhexidine (PERIDEX) 0.12 % solution     . cholecalciferol (VITAMIN D3) 2,000 unit capsule Take 2,000 Units by mouth once daily.    . clobetasol (CORMAX) 0.05 % external solution Reported on 06/29/2015     . cyanocobalamin (VITAMIN B-12) 1000 MCG tablet Take 1,000 mcg by mouth once daily.    [name] Kitchen dutasteride (AVODART) 0.5 mg capsule Take by mouth.    . dutasteride (AVODART) 0.5 mg capsule     . esomeprazole (NEXIUM) 40 MG DR capsule TAKE 1 CAPSULE BY MOUTH EVERY DAY 90 capsule 2  . ferrous sulfate 325 (65 FE) MG  tablet Take 65 mg by mouth every other day    . LIPITOR 20 mg tablet     . naproxen (NAPROSYN) 250 MG tablet     . polyethylene glycol (MIRALAX) packet Take 17 g by mouth once daily. Mix in 4-8ounces of fluid prior to taking.    [name] Kitchen SYNTHROID 125 mcg tablet Take 1 tablet (125 mcg total) by mouth once daily Take on an empty stomach with a glass of water at least 30-60 minutes before breakfast. 90 tablet 3  . tamsulosin (FLOMAX) 0.4 mg capsule Take 0.4 mg by mouth once daily. Take 30 minutes after same meal each day.    [name] Kitchen VIT C/VITE AC/LUT/COPPER/ZNOX (PRESERVISION LUTEIN ORAL) Take by mouth.     No current facility-administered medications for this visit.       ALLERGIES: Patient has no known allergies.  PAST MEDICAL HISTORY:     Past Medical History:  Diagnosis Date  . AAA (abdominal aortic aneurysm) (CMS-HCC)   . Allergic state   . Anemia   . Bladder wall thickening 11/29/2017  . CAD (coronary artery disease)   . Chest pain on exertion, unspecified 11/29/2017  . Chronic superficial gastritis without bleeding 11/10/2014  . Discomfort of groin, right 12/01/2017  . GERD (gastroesophageal reflux disease)   . 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 Hyperlipidemia   . Hypertension   . Hypothyroidism   . IBS (irritable bowel syndrome)   . Ileal erosions 11/10/2014  . Left inguinal hernia 12/01/2017  . Ruptured lumbar disc    Neurosurgery evaluation, epidural injections, not a surgical candidate due to back being severely damanged.  . Vitamin D deficiency     PAST SURGICAL HISTORY:      Past Surgical History:  Procedure Laterality Date  . CAPSULE ENDOSCOPY  10/2013  . COLONOSCOPY  06/26/1996   Diverticulosis  . COLONOSCOPY  08/21/2005   Tubulovillous Adenoma  . COLONOSCOPY  01/04/2009   Adenomatous Polyp: No repeat due to age per RTE  . COLONOSCOPY  10/20/2014   PH Adenomatous Polyps: No repeat due to age per RTE   . EGD   08/21/2005, 06/26/1996, 01/17/1988  . EGD  10/20/2014   No repeat per RTE  . HERNIA REPAIR    . PTCA  08/24/92   PTCA of PDA     FAMILY HISTORY:      Family History  Problem Relation Age of Onset  . Heart disease Brother   . No Known Problems Mother   . No Known Problems Father      SOCIAL HISTORY: Social History          Socioeconomic History  . Marital status: Married    Spouse name: Not on file  . Number of children: Not on file  . Years of education: Not on file  .  Highest education level: Not on file  Occupational History  . Not on file  Social Needs  . Financial resource strain: Not on file  . Food insecurity:    Worry: Not on file    Inability: Not on file  . Transportation needs:    Medical: Not on file    Non-medical: Not on file  Tobacco Use  . Smoking status: Never Smoker  . Smokeless tobacco: Never Used  Substance and Sexual Activity  . Alcohol use: No    Alcohol/week: 0.0 standard drinks  . Drug use: No  . Sexual activity: Defer  Other Topics Concern  . Not on file  Social History Narrative  . Not on file      PHYSICAL EXAM:  GENERAL: Alert, active, oriented x3  HEENT: Pupils equal reactive to light. Extraocular movements are intact. Sclera clear. Palpebral conjunctiva normal red color.Pharynx clear.  NECK: Supple with no palpable mass and no adenopathy.  LUNGS: Sound clear with no rales rhonchi or wheezes.  HEART: Regular rhythm S1 and S2 without murmur.  ABDOMEN: Soft and depressible, nontender with no palpable mass, no hepatomegaly.  Left inguinal hernia, reducible, mild to moderate tenderness to palpation of reduction.  There is no palpable hernia on the right side.  Patient was evaluated in the sitting and standing position and with Valsalva maneuver and no hernia was appreciated on the right groin.  EXTREMITIES: Well-developed well-nourished symmetrical with no dependent edema.  NEUROLOGICAL: Awake  alert oriented, facial expression symmetrical, moving all extremities.  REVIEW OF DATA: I have reviewed the following data today:      Appointment on 09/24/2018  Component Date Value  . Thyroid Stimulating Horm* 09/24/2018 2.001   . Glucose 09/24/2018 83   . Sodium 09/24/2018 140   . Potassium 09/24/2018 4.2   . Chloride 09/24/2018 104   . Carbon Dioxide (CO2) 09/24/2018 30.4   . Urea Nitrogen (BUN) 09/24/2018 11   . Creatinine 09/24/2018 0.9   . Glomerular Filtration Ra* 09/24/2018 80   . Calcium 09/24/2018 8.7   . AST  09/24/2018 26   . ALT  09/24/2018 21   . Alk Phos (alkaline Phosp* 09/24/2018 46   . Albumin 09/24/2018 3.8   . Bilirubin, Total 09/24/2018 0.6   . Protein, Total 09/24/2018 5.7*  . A/G Ratio 09/24/2018 2.0   . WBC (White Blood Cell Co* 09/24/2018 4.9   . RBC (Red Blood Cell Coun* 09/24/2018 3.67*  . Hemoglobin 09/24/2018 12.2*  . Hematocrit 09/24/2018 36.1*  . MCV (Mean Corpuscular Vo* 09/24/2018 98.4   . MCH (Mean Corpuscular He* 09/24/2018 33.2*  . MCHC (Mean Corpuscular H* 09/24/2018 33.8   . Platelet Count 09/24/2018 143*  . RDW-CV (Red Cell Distrib* 09/24/2018 13.5   . MPV (Mean Platelet Volum* 09/24/2018 10.8   . Neutrophils 09/24/2018 2.75   . Lymphocytes 09/24/2018 1.29   . Monocytes 09/24/2018 0.57   . Eosinophils 09/24/2018 0.23   . Basophils 09/24/2018 0.04   . Neutrophil % 09/24/2018 56.2   . Lymphocyte % 09/24/2018 26.4   . Monocyte % 09/24/2018 11.7   . Eosinophil % 09/24/2018 4.7   . Basophil% 09/24/2018 0.8   . Immature Granulocyte % 09/24/2018 0.2   . Immature Granulocyte Cou* 09/24/2018 0.01      ASSESSMENT: Brad Brady is a 85 y.o. male presenting for consultation for left inguinal hernia.    The patient presents with a symptomatic, reducible inguinal hernia. Patient was oriented about the diagnosis of inguinal hernia  and its implication. The patient was oriented about the treatment alternatives (observation vs surgical repair).  Due to patient symptoms, repair is recommended. Patient oriented about the surgical procedure, the use of mesh and its risk of complications such as: infection, bleeding, injury to vas deference, vasculature and testicle, injury to bowel or bladder, and chronic pain.  Medical with the patient that I cannot guarantee that the pain in the right groin will improve with the repair of the left inguinal hernia.  Since he is symptomatic on the left inguinal hernia I would recommend to proceed with left inguinal hernia repair.  Continue evaluation of the right groin pain.  He might need repeat injections in the right groin area.  Non-recurrent unilateral inguinal hernia without obstruction or gangrene [K40.90]  PLAN: 1. Left inguinal hernia repair with mesh (71245) 2. CBC, CMP done 09/24/2018 3. Internal Medicine clearance by Dr. Edwina Barth 4. Stop taking aspirin 5 days before procedure 5. Contact us if has any question or concern.   Patient verbalized understanding, all questions were answered, and were agreeable with the plan outlined above.   This encounter was more than 40 minutes, more than 50% of the time counseling the patient and coordinating plan of care.  Herbert Pun, MD  Electronically signed by Herbert Pun, MD

## 2018-10-16 ENCOUNTER — Encounter
Admission: RE | Admit: 2018-10-16 | Discharge: 2018-10-16 | Disposition: A | Discharge status: Home / Self Care | Payer: Medicare Other | Source: Ambulatory Visit | Attending: General Surgery | Admitting: General Surgery

## 2018-10-16 ENCOUNTER — Other Ambulatory Visit: Payer: Self-pay

## 2018-10-16 DIAGNOSIS — Z01812 Encounter for preprocedural laboratory examination: Secondary | ICD-10-CM | POA: Insufficient documentation

## 2018-10-16 DIAGNOSIS — Z20828 Contact with and (suspected) exposure to other viral communicable diseases: Secondary | ICD-10-CM | POA: Insufficient documentation

## 2018-10-16 HISTORY — DX: Hypothyroidism, unspecified: E03.9

## 2018-10-16 NOTE — Patient Instructions (Signed)
Your procedure is scheduled on:10/20/18 Report to Day Surgery.[facility] To find out your arrival time please call [phone] between 1PM - 3PM on 10/17/18.  Remember: Instructions that are not followed completely may result in serious medical risk,  up to and including death, or upon the discretion of your surgeon and anesthesiologist your  surgery may need to be rescheduled.     _X__ 1. Do not eat food after midnight the night before your procedure.                 No gum chewing or hard candies. You may drink clear liquids up to 2 hours                 before you are scheduled to arrive for your surgery- DO not drink clear                 liquids within 2 hours of the start of your surgery.                 Clear Liquids include:  water, apple juice without pulp, clear carbohydrate                 drink such as Clearfast of Gatorade, Black Coffee or Tea (Do not add                 anything to coffee or tea).  __X__2.  On the morning of surgery brush your teeth with toothpaste and water, you                may rinse your mouth with mouthwash if you wish.  Do not swallow any toothpaste of mouthwash.     _X__ 3.  No Alcohol for 24 hours before or after surgery.   _X__ 4.  Do Not Smoke or use e-cigarettes For 24 Hours Prior to Your Surgery.                 Do not use any chewable tobacco products for at least 6 hours prior to                 surgery.  ____  5.  Bring all medications with you on the day of surgery if instructed.   __X__  6.  Notify your doctor if there is any change in your medical condition      (cold, fever, infections).     Do not wear jewelry, make-up, hairpins, clips or nail polish. Do not wear lotions, powders, or perfumes. You may wear deodorant. Do not shave 48 hours prior to surgery. Men may shave face and neck. Do not bring valuables to the hospital.    [facility] is not responsible for any belongings or  valuables.  Contacts, dentures or bridgework may not be worn into surgery. Leave your suitcase in the car. After surgery it may be brought to your room. For patients admitted to the hospital, discharge time is determined by your treatment team.   Patients discharged the day of surgery will not be allowed to drive home.   Please read over the following fact sheets that you were given:   Surgical Site Infection Prevention          __X__ Take these medicines the morning of surgery with A SIP OF WATER:    1. TAMSULOSIN  2. AVODART  3. NEXIUM  4.SYNTHROID  5.  6.  ____ Fleet Enema (as directed)   X____ Use  CHG Soap as directed  ____ Use inhalers on the day of surgery  ____ Stop metformin 2 days prior to surgery    ____ Take 1/2 of usual insulin dose the night before surgery. No insulin the morning          of surgery.   __X__ Stop Coumadin/Plavix/aspirin on   ALREADY STOPPED  ____ Stop Anti-inflammatories on    ____ Stop supplements until after surgery.    ____ Bring C-Pap to the hospital.

## 2018-10-17 LAB — SARS CORONAVIRUS 2 (TAT 6-24 HRS): SARS Coronavirus 2: NEGATIVE

## 2018-10-19 MED ORDER — CEFAZOLIN SODIUM-DEXTROSE 2-4 GM/100ML-% IV SOLN
2.0000 g | INTRAVENOUS | Status: AC
  Administered 2018-10-20: 2 g via INTRAVENOUS

## 2018-10-20 ENCOUNTER — Other Ambulatory Visit: Payer: Self-pay

## 2018-10-20 ENCOUNTER — Ambulatory Visit
Admission: RE | Admit: 2018-10-20 | Discharge: 2018-10-20 | Disposition: A | Discharge status: Home / Self Care | Payer: Medicare Other | Attending: General Surgery | Admitting: General Surgery

## 2018-10-20 ENCOUNTER — Encounter
Admission: RE | Disposition: A | Discharge status: Home / Self Care | Payer: Self-pay | Source: Home / Self Care | Attending: General Surgery

## 2018-10-20 ENCOUNTER — Ambulatory Visit: Payer: Medicare Other | Admitting: Anesthesiology

## 2018-10-20 DIAGNOSIS — Z791 Long term (current) use of non-steroidal anti-inflammatories (NSAID): Secondary | ICD-10-CM | POA: Diagnosis not present

## 2018-10-20 DIAGNOSIS — I714 Abdominal aortic aneurysm, without rupture: Secondary | ICD-10-CM | POA: Diagnosis not present

## 2018-10-20 DIAGNOSIS — D176 Benign lipomatous neoplasm of spermatic cord: Secondary | ICD-10-CM | POA: Diagnosis not present

## 2018-10-20 DIAGNOSIS — D649 Anemia, unspecified: Secondary | ICD-10-CM | POA: Diagnosis not present

## 2018-10-20 DIAGNOSIS — I2510 Atherosclerotic heart disease of native coronary artery without angina pectoris: Secondary | ICD-10-CM | POA: Insufficient documentation

## 2018-10-20 DIAGNOSIS — K4090 Unilateral inguinal hernia, without obstruction or gangrene, not specified as recurrent: Secondary | ICD-10-CM | POA: Diagnosis present

## 2018-10-20 DIAGNOSIS — K219 Gastro-esophageal reflux disease without esophagitis: Secondary | ICD-10-CM | POA: Diagnosis not present

## 2018-10-20 DIAGNOSIS — K4030 Unilateral inguinal hernia, with obstruction, without gangrene, not specified as recurrent: Secondary | ICD-10-CM | POA: Diagnosis not present

## 2018-10-20 DIAGNOSIS — Z79890 Hormone replacement therapy (postmenopausal): Secondary | ICD-10-CM | POA: Diagnosis not present

## 2018-10-20 DIAGNOSIS — E559 Vitamin D deficiency, unspecified: Secondary | ICD-10-CM | POA: Insufficient documentation

## 2018-10-20 DIAGNOSIS — E785 Hyperlipidemia, unspecified: Secondary | ICD-10-CM | POA: Diagnosis not present

## 2018-10-20 DIAGNOSIS — I10 Essential (primary) hypertension: Secondary | ICD-10-CM | POA: Diagnosis not present

## 2018-10-20 DIAGNOSIS — Z7982 Long term (current) use of aspirin: Secondary | ICD-10-CM | POA: Insufficient documentation

## 2018-10-20 DIAGNOSIS — E039 Hypothyroidism, unspecified: Secondary | ICD-10-CM | POA: Insufficient documentation

## 2018-10-20 DIAGNOSIS — Z79899 Other long term (current) drug therapy: Secondary | ICD-10-CM | POA: Diagnosis not present

## 2018-10-20 HISTORY — PX: INGUINAL HERNIA REPAIR: SHX194

## 2018-10-20 LAB — TROPONIN I (HIGH SENSITIVITY): Troponin I (High Sensitivity): 5 ng/L (ref <18)

## 2018-10-20 SURGERY — REPAIR, HERNIA, INGUINAL, ADULT
Anesthesia: General | Laterality: Left

## 2018-10-20 MED ORDER — LIDOCAINE HCL (PF) 2 % IJ SOLN
INTRAMUSCULAR | Status: AC
  Filled 2018-10-20: qty 10

## 2018-10-20 MED ORDER — BUPIVACAINE-EPINEPHRINE (PF) 0.25% -1:200000 IJ SOLN
INTRAMUSCULAR | Status: AC
  Filled 2018-10-20: qty 30

## 2018-10-20 MED ORDER — PROPOFOL 10 MG/ML IV BOLUS
INTRAVENOUS | Status: DC | PRN
  Administered 2018-10-20: 20 mg via INTRAVENOUS
  Administered 2018-10-20: 120 mg via INTRAVENOUS

## 2018-10-20 MED ORDER — SUCCINYLCHOLINE CHLORIDE 20 MG/ML IJ SOLN
INTRAMUSCULAR | Status: AC
  Filled 2018-10-20: qty 1

## 2018-10-20 MED ORDER — OXYCODONE HCL 5 MG/5ML PO SOLN: 5.0000 mg | Freq: Once | ORAL | Status: DC | PRN

## 2018-10-20 MED ORDER — ONDANSETRON HCL 4 MG/2ML IJ SOLN
INTRAMUSCULAR | Status: DC | PRN
  Administered 2018-10-20: 4 mg via INTRAVENOUS

## 2018-10-20 MED ORDER — FENTANYL CITRATE (PF) 100 MCG/2ML IJ SOLN: 25.0000 ug | INTRAMUSCULAR | Status: DC | PRN

## 2018-10-20 MED ORDER — EPHEDRINE SULFATE 50 MG/ML IJ SOLN
INTRAMUSCULAR | Status: DC | PRN
  Administered 2018-10-20 (×2): 5 mg via INTRAVENOUS

## 2018-10-20 MED ORDER — OXYCODONE HCL 5 MG PO TABS: 5.0000 mg | ORAL_TABLET | Freq: Once | ORAL | Status: DC | PRN

## 2018-10-20 MED ORDER — FENTANYL CITRATE (PF) 100 MCG/2ML IJ SOLN
INTRAMUSCULAR | Status: DC | PRN
  Administered 2018-10-20: 25 ug via INTRAVENOUS

## 2018-10-20 MED ORDER — SOD CITRATE-CITRIC ACID 500-334 MG/5ML PO SOLN
30.0000 mL | Freq: Once | ORAL | Status: AC
  Administered 2018-10-20: 30 mL via ORAL
  Filled 2018-10-20: qty 30

## 2018-10-20 MED ORDER — ALUM & MAG HYDROXIDE-SIMETH 200-200-20 MG/5ML PO SUSP
ORAL | Status: AC
  Filled 2018-10-20: qty 30

## 2018-10-20 MED ORDER — LIDOCAINE HCL (CARDIAC) PF 100 MG/5ML IV SOSY
PREFILLED_SYRINGE | INTRAVENOUS | Status: DC | PRN
  Administered 2018-10-20: 100 mg via INTRAVENOUS

## 2018-10-20 MED ORDER — ROCURONIUM BROMIDE 50 MG/5ML IV SOLN
INTRAVENOUS | Status: AC
  Filled 2018-10-20: qty 1

## 2018-10-20 MED ORDER — HYDROCODONE-ACETAMINOPHEN 5-325 MG PO TABS: 1.0000 | ORAL_TABLET | ORAL | 0 refills | Status: AC | PRN

## 2018-10-20 MED ORDER — ONDANSETRON HCL 4 MG/2ML IJ SOLN
INTRAMUSCULAR | Status: AC
  Filled 2018-10-20: qty 2

## 2018-10-20 MED ORDER — FENTANYL CITRATE (PF) 100 MCG/2ML IJ SOLN
INTRAMUSCULAR | Status: AC
  Filled 2018-10-20: qty 2

## 2018-10-20 MED ORDER — CEFAZOLIN SODIUM-DEXTROSE 2-4 GM/100ML-% IV SOLN
INTRAVENOUS | Status: AC
  Filled 2018-10-20: qty 100

## 2018-10-20 MED ORDER — SOD CITRATE-CITRIC ACID 500-334 MG/5ML PO SOLN
30.0000 mL | Freq: Three times a day (TID) | ORAL | Status: DC
  Filled 2018-10-20 (×5): qty 30

## 2018-10-20 MED ORDER — PROPOFOL 10 MG/ML IV BOLUS
INTRAVENOUS | Status: AC
  Filled 2018-10-20: qty 20

## 2018-10-20 MED ORDER — BUPIVACAINE-EPINEPHRINE 0.25% -1:200000 IJ SOLN
INTRAMUSCULAR | Status: DC | PRN
  Administered 2018-10-20: 30 mL

## 2018-10-20 MED ORDER — LACTATED RINGERS IV SOLN
INTRAVENOUS | Status: DC
  Administered 2018-10-20 (×2): via INTRAVENOUS

## 2018-10-20 SURGICAL SUPPLY — 31 items
BLADE SURG 15 STRL LF DISP TIS (BLADE) ×1 IMPLANT
BLADE SURG 15 STRL SS (BLADE) ×2
CANISTER SUCT 1200ML W/VALVE (MISCELLANEOUS) ×3 IMPLANT
CHLORAPREP W/TINT 26 (MISCELLANEOUS) ×3 IMPLANT
COVER WAND RF STERILE (DRAPES) ×3 IMPLANT
DERMABOND ADVANCED (GAUZE/BANDAGES/DRESSINGS) ×2
DERMABOND ADVANCED .7 DNX12 (GAUZE/BANDAGES/DRESSINGS) ×1 IMPLANT
DRAIN PENROSE 1/4X12 LTX STRL (WOUND CARE) ×3 IMPLANT
DRAPE LAPAROTOMY 100X77 ABD (DRAPES) ×3 IMPLANT
ELECT REM PT RETURN 9FT ADLT (ELECTROSURGICAL) ×3
ELECTRODE REM PT RTRN 9FT ADLT (ELECTROSURGICAL) ×1 IMPLANT
GLOVE BIO SURGEON STRL SZ 6.5 (GLOVE) ×4 IMPLANT
GLOVE BIO SURGEONS STRL SZ 6.5 (GLOVE) ×2
GLOVE BIOGEL PI IND STRL 6.5 (GLOVE) ×1 IMPLANT
GLOVE BIOGEL PI INDICATOR 6.5 (GLOVE) ×2
GOWN STRL REUS W/ TWL LRG LVL3 (GOWN DISPOSABLE) ×2 IMPLANT
GOWN STRL REUS W/TWL LRG LVL3 (GOWN DISPOSABLE) ×4
LABEL OR SOLS (LABEL) ×3 IMPLANT
MESH HERNIA 6X13 (Mesh General) ×2 IMPLANT
NEEDLE HYPO 22GX1.5 SAFETY (NEEDLE) ×3 IMPLANT
NS IRRIG 500ML POUR BTL (IV SOLUTION) ×3 IMPLANT
PACK BASIN MINOR ARMC (MISCELLANEOUS) ×3 IMPLANT
SUT MNCRL 4-0 (SUTURE) ×2
SUT MNCRL 4-0 27XMFL (SUTURE) ×1
SUT SURGILON 0 BLK (SUTURE) ×6 IMPLANT
SUT VIC AB 2-0 BRD 54 (SUTURE) ×3 IMPLANT
SUT VIC AB 2-0 CT2 27 (SUTURE) ×3 IMPLANT
SUT VIC AB 3-0 SH 27 (SUTURE) ×4
SUT VIC AB 3-0 SH 27X BRD (SUTURE) ×2 IMPLANT
SUTURE MNCRL 4-0 27XMF (SUTURE) ×1 IMPLANT
SYR 10ML LL (SYRINGE) ×3 IMPLANT

## 2018-10-20 NOTE — Transfer of Care (Signed)
Immediate Anesthesia Transfer of Care Note  Patient: CHASE ARRENDALE  Procedure(s) Performed: OPEN LEFT HERNIA REPAIR INGUINAL ADULT (Left )  Patient Location: PACU  Anesthesia Type:General  Level of Consciousness: sedated  Airway & Oxygen Therapy: Patient Spontanous Breathing and Patient connected to face mask oxygen  Post-op Assessment: Report given to RN and Post -op Vital signs reviewed and stable  Post vital signs: Reviewed and stable  Last Vitals:  Vitals Value Taken Time  BP 117/55 10/20/18 0902  Temp 36 C 10/20/18 0901  Pulse 73 10/20/18 0903  Resp 10 10/20/18 0903  SpO2 100 % 10/20/18 0903  Vitals shown include unvalidated device data.  Last Pain:  Vitals:   10/20/18 0901  TempSrc:   PainSc: 0-No pain         Complications: No apparent anesthesia complications

## 2018-10-20 NOTE — Anesthesia Postprocedure Evaluation (Signed)
Anesthesia Post Note  Patient: Brad Brady  Procedure(s) Performed: OPEN LEFT HERNIA REPAIR INGUINAL ADULT (Left )  Patient location during evaluation: PACU Anesthesia Type: General Level of consciousness: awake and alert Pain management: pain level controlled Vital Signs Assessment: post-procedure vital signs reviewed and stable Respiratory status: spontaneous breathing, nonlabored ventilation and respiratory function stable Cardiovascular status: blood pressure returned to baseline and stable Postop Assessment: no apparent nausea or vomiting Anesthetic complications: no   Pt c/o chest tightness in PACU, lots of belching noted, states has chest tightness sometimes with indigestion.  Chest tightness was partially relieved with sodium citrate.  EKG consistent with previous.  High sensitivity troponin ordered, WNL and lower than previous measurement last month.  Chest tightness markedly improved.  Determined to be stable for discharge.  Last Vitals:  Vitals:   10/20/18 1101 10/20/18 1116  BP: 130/73 132/74  Pulse: 78 73  Resp: 10 12  Temp:  (!) 36.3 C  SpO2: 96% 96%    Last Pain:  Vitals:   10/20/18 1116  TempSrc:   PainSc: [place]

## 2018-10-20 NOTE — Anesthesia Preprocedure Evaluation (Signed)
Anesthesia Evaluation  Patient identified by MRN, date of birth, ID band Patient awake    Reviewed: Allergy & Precautions, H&P , NPO status , Patient's Chart, lab work & pertinent test results  Airway Mallampati: II  TM Distance: >3 FB     Dental  (+) Chipped   Pulmonary neg pulmonary ROS, neg COPD, neg recent URI,           Cardiovascular hypertension, (-) angina+ CAD  (-) Past MI and (-) Cardiac Stents (-) dysrhythmias      Neuro/Psych negative neurological ROS  negative psych ROS   GI/Hepatic Neg liver ROS, GERD  Controlled,  Endo/Other  Hypothyroidism   Renal/GU      Musculoskeletal   Abdominal   Peds  Hematology negative hematology ROS (+)   Anesthesia Other Findings Past Medical History: No date: Anemia No date: BPH (benign prostatic hyperplasia) No date: Coronary artery disease No date: GERD (gastroesophageal reflux disease) No date: 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 No date: High cholesterol No date: Hypertension No date: Hypothyroidism No date: IBS (irritable bowel syndrome) No date: Thyroid disease No date: Vitamin D deficiency  Past Surgical History: No date: CARDIAC CATHETERIZATION No date: COLONOSCOPY 10/20/2014: COLONOSCOPY WITH PROPOFOL; N/A     Comment:  Procedure: COLONOSCOPY WITH PROPOFOL;  Surgeon: Manya Silvas, MD;  Location: [facility] ENDOSCOPY;  Service:               Endoscopy;  Laterality: N/A; 10/20/2014: ESOPHAGOGASTRODUODENOSCOPY (EGD) WITH PROPOFOL; N/A     Comment:  Procedure: ESOPHAGOGASTRODUODENOSCOPY (EGD) WITH               PROPOFOL;  Surgeon: Manya Silvas, MD;  Location: [facility]              ENDOSCOPY;  Service: Endoscopy;  Laterality: N/A; 1996: HERNIA REPAIR; Right No date: PTCA of PDA  BMI    Body Mass Index: 24.41 kg/m      Reproductive/Obstetrics negative OB ROS                            Anesthesia  Physical Anesthesia Plan  ASA: II  Anesthesia Plan: General ETT   Post-op Pain Management:    Induction:   PONV Risk Score and Plan: Ondansetron, Dexamethasone and Treatment may vary due to age or medical condition  Airway Management Planned:   Additional Equipment:   Intra-op Plan:   Post-operative Plan:   Informed Consent: I have reviewed the patients History and Physical, chart, labs and discussed the procedure including the risks, benefits and alternatives for the proposed anesthesia with the patient or authorized representative who has indicated his/her understanding and acceptance.     Dental Advisory Given  Plan Discussed with: Anesthesiologist and CRNA  Anesthesia Plan Comments:        Anesthesia Quick Evaluation

## 2018-10-20 NOTE — Anesthesia Post-op Follow-up Note (Signed)
Anesthesia QCDR form completed.        

## 2018-10-20 NOTE — Interval H&P Note (Signed)
History and Physical Interval Note:  10/20/2018 6:55 AM  Brad Brady  has presented today for surgery, with the diagnosis of K40.90 NON RECURRENT UNILTERAL INQUINAL HERNIA W/O OBSTRUCTION OR GANGRENE.  The various methods of treatment have been discussed with the patient and family. After consideration of risks, benefits and other options for treatment, the patient has consented to  Procedure(s): OPEN LEFT HERNIA REPAIR INGUINAL ADULT (Left) as a surgical intervention.  The patient's history has been reviewed, patient examined, no change in status, stable for surgery.  I have reviewed the patient's chart and labs.  Left inguinal area marked in the pre procedure room. Questions were answered to the patient's satisfaction.     Herbert Pun

## 2018-10-20 NOTE — Anesthesia Procedure Notes (Signed)
Procedure Name: LMA Insertion Date/Time: 10/20/2018 7:35 AM Performed by: Hedda Slade, CRNA Pre-anesthesia Checklist: Patient identified, Patient being monitored, Timeout performed, Emergency Drugs available and Suction available Patient Re-evaluated:Patient Re-evaluated prior to induction Oxygen Delivery Method: Circle system utilized Preoxygenation: Pre-oxygenation with 100% oxygen Induction Type: IV induction Ventilation: Mask ventilation without difficulty LMA: LMA inserted LMA Size: 5.0 Tube type: Oral Number of attempts: 2 Placement Confirmation: positive ETCO2 and breath sounds checked- equal and bilateral Tube secured with: Tape Dental Injury: Teeth and Oropharynx as per pre-operative assessment  Comments: Unable to achieve adequate seal with 4.5 LMA

## 2018-10-20 NOTE — Discharge Instructions (Signed)
AMBULATORY SURGERY  DISCHARGE INSTRUCTIONS   1) The drugs that you were given will stay in your system until tomorrow so for the next 24 hours you should not:  A) Drive an automobile B) Make any legal decisions C) Drink any alcoholic beverage   2) You may resume regular meals tomorrow.  Today it is better to start with liquids and gradually work up to solid foods.  You may eat anything you prefer, but it is better to start with liquids, then soup and crackers, and gradually work up to solid foods.   3) Please notify your doctor immediately if you have any unusual bleeding, trouble breathing, redness and pain at the surgery site, drainage, fever, or pain not relieved by medication.    4) Additional Instructions:        Please contact your physician with any problems or Same Day Surgery at [phone], Monday through Friday 6 am to 4 pm, or [place] [facility] [place] [facility] number at [phone]. Diet: Resume home heart healthy regular diet.   Activity: No heavy lifting >20 pounds (children, pets, laundry, garbage) or strenuous activity until follow-up, but light activity and walking are encouraged. Do not drive or drink alcohol if taking narcotic pain medications.  Wound care: May shower with soapy water and pat dry (do not rub incisions), but no baths or submerging incision underwater until follow-up. (no swimming)   Medications: Resume all home medications. For mild to moderate pain: acetaminophen (Tylenol) or ibuprofen (if no kidney disease). Combining Tylenol with alcohol can substantially increase your risk of causing liver disease. Narcotic pain medications, if prescribed, can be used for severe pain, though may cause nausea, constipation, and drowsiness. Do not combine Tylenol and Norco within a 6 hour period as Norco contains Tylenol. If you do not need the narcotic pain medication, you do not need to fill the prescription.  Call office ([phone]) at any time if any  questions, worsening pain, fevers/chills, bleeding, drainage from incision site, or other concerns.  

## 2018-10-20 NOTE — Op Note (Signed)
Preoperative diagnosis: Left Inguinal Hernia.  Postoperative diagnosis: Left Indirect Incarcerated Inguinal Hernia.  Procedure: Left Incarcerated Inguinal hernia repair with mesh  Anesthesia: General  Surgeon: Dr. Windell Moment  Wound Classification: Clean  Indications:  Patient is a 85 y.o. male developed a symptomatic left inguinal hernia. Repair was indicated to avoid complications of incarceration, obstruction and pain, and a prosthetic mesh repair was elected.  Findings: 1.  Hernia was unable to be reduced after induction of anesthesia. 2. Vas Deferens and cord structures identified and preserved 3. A indirect inguinal hernia was identified 4. Pre shaped Hernia System used for repair 5. Adequate hemostasis achieved  Description of procedure: The patient was taken to the operating room. A time-out was completed verifying correct patient, procedure, site, positioning, and implant(s) and/or special equipment prior to beginning this procedure. General anesthesia was induced. The left groin was prepped and draped in the usual sterile fashion. An incision was marked in a natural skin crease and planned to end near the pubic tubercle.  The skin crease incision was made with a knife and deepened through Scarpa's and Camper's fascia with electrocautery until the aponeurosis of the external oblique was encountered. This was cleaned and the external ring was exposed. Hemostasis was achieved in the wound. An incision was made in the midportion of the external oblique aponeurosis in the direction of its fibers. The ilioinguinal nerve was identified and protected throughout the dissection. Flaps of the external oblique were developed cephalad and inferiorly.  The cord was identified. It was gently dissected free at the pubic tubercle and encircled with a Penrose drain. Attention was directed to the anteromedial aspect of the cord, where an indirect hernia sac was identified. The sac was carefully  dissected free of the cord down to the level of the internal ring. The vas and testicular vessels were identified and protected from harm. The sac was opened.  Mental was identified attached to the distal sac.  This was pulling the colon into the hernia sac and unable to be reduced.  Adhesion was dissected sharply and the omental fat and the colon was able to be reduced. A finger was passed into the peritoneal cavity and the floor of the inguinal canal assessed and found to be strong. The femoral canal was palpated and no hernia identified. The sac was twisted and suture ligated with 2-0 silk. Redundant sac was excised and submitted to pathology. The stump of the sac was checked for hemostasis and allowed to retract into the abdomen.  Cord lipoma was also dissected from the cord structures, ligated and resected. Attention then turned to the floor of the canal, which appeared to be grossly weakened without a well-defined defect or sac. The large pre shaped hernia System mesh was inserted. Beginning at the pubic tubercle, the mesh was sutured to the inguinal ligament inferiorly and the conjoint tendon superiorly using interrupted 0 nonabsorbable sutures. Care was taken to assure that the mesh was placed in a relaxed fashion to avoid excessive tension and that no neurovascular structures were caught in the repair. Laterally, the tails of the mesh were crossed and the internal ring recreated.  Hemostasis was again checked. The Penrose drain was removed. The external oblique aponeurosis was closed with a running suture of 3-0 Vicryl, taking care not to catch the ilioinguinal nerve in the suture line. Scarpa's fascia was closed with interrupted 3-0 Vicryl.  The skin was closed with a subcuticular stitch of Monocryl 4-0. Dermabond was applied.  The testis was  gently pulled down into its anatomic position in the scrotum.  The patient tolerated the procedure well and was taken to the postanesthesia care unit in stable  condition.   Specimen: Hernia sac and cord lipoma  Complications: None  Estimated Blood Loss: 10 mL

## 2018-10-21 LAB — SURGICAL PATHOLOGY

## 2018-11-17 ENCOUNTER — Other Ambulatory Visit (INDEPENDENT_AMBULATORY_CARE_PROVIDER_SITE_OTHER): Payer: Self-pay | Admitting: Vascular Surgery

## 2018-11-17 DIAGNOSIS — I714 Abdominal aortic aneurysm, without rupture, unspecified: Secondary | ICD-10-CM

## 2018-11-18 ENCOUNTER — Ambulatory Visit (INDEPENDENT_AMBULATORY_CARE_PROVIDER_SITE_OTHER): Payer: Medicare Other | Admitting: Vascular Surgery

## 2018-11-18 ENCOUNTER — Other Ambulatory Visit: Payer: Self-pay

## 2018-11-18 ENCOUNTER — Ambulatory Visit (INDEPENDENT_AMBULATORY_CARE_PROVIDER_SITE_OTHER): Payer: Medicare Other

## 2018-11-18 ENCOUNTER — Encounter (INDEPENDENT_AMBULATORY_CARE_PROVIDER_SITE_OTHER): Payer: Self-pay | Admitting: Vascular Surgery

## 2018-11-18 VITALS — BP 128/74 | HR 57 | Resp 16 | Wt 172.0 lb

## 2018-11-18 DIAGNOSIS — I10 Essential (primary) hypertension: Secondary | ICD-10-CM

## 2018-11-18 DIAGNOSIS — I723 Aneurysm of iliac artery: Secondary | ICD-10-CM

## 2018-11-18 DIAGNOSIS — I714 Abdominal aortic aneurysm, without rupture, unspecified: Secondary | ICD-10-CM

## 2018-11-18 DIAGNOSIS — I77819 Aortic ectasia, unspecified site: Secondary | ICD-10-CM

## 2018-11-18 DIAGNOSIS — E7800 Pure hypercholesterolemia, unspecified: Secondary | ICD-10-CM

## 2018-11-18 NOTE — Assessment & Plan Note (Signed)
Duplex today shows the abdominal aorta to be right at 3 cm in maximal diameter and the left common iliac artery to be 1.6 cm in maximal diameter.  The sizes really have not changed significantly from his study 2 years ago.  They pose him no immediate risk, but we will continue to follow these on every other year basis at this point.

## 2018-11-18 NOTE — Progress Notes (Signed)
MRN : [id number]  Brad Brady is a 85 y.o. (1928/02/03) male who presents with chief complaint of  Chief Complaint  Patient presents with  . Follow-up    ultrasound follow up  .  History of Present Illness: Patient returns today in follow up of his abdominal aortic aneurysm and left common iliac artery aneurysm.  He is doing well today without complaints.  He has no aneurysm related symptoms. Specifically, the patient denies new back or abdominal pain, or signs of peripheral embolization. Duplex today shows the abdominal aorta to be right at 3 cm in maximal diameter and the left common iliac artery to be 1.6 cm in maximal diameter.   Current Outpatient Medications  Medication Sig Dispense Refill  . acetaminophen (TYLENOL) 500 MG tablet Take 500 mg by mouth every 6 (six) hours as needed for moderate pain.     [name] Kitchen aspirin EC 81 MG tablet Take 81 mg by mouth every other day.    [name] Kitchen atenolol (TENORMIN) 25 MG tablet Take 1 tablet (25 mg total) by mouth daily. (Patient taking differently: Take 12.5 mg by mouth daily. ) 30 tablet 0  . atorvastatin (LIPITOR) 20 MG tablet Take 20 mg by mouth at bedtime.    . Cholecalciferol (VITAMIN D3) 2000 UNITS capsule Take 2,000 Units by mouth every morning.    . dutasteride (AVODART) 0.5 MG capsule Take 0.5 mg by mouth daily.    [name] Kitchen esomeprazole (NEXIUM) 40 MG capsule Take 40 mg by mouth every other day.     . Ferrous Sulfate (IRON) 325 (65 Fe) MG TABS Take 325 mg by mouth every other day.     . Multiple Vitamins-Minerals (PRESERVISION/LUTEIN PO) Take 1 tablet by mouth 2 (two) times daily.     [name] Kitchen senna-docusate (SENOKOT-S) 8.6-50 MG per tablet Take 2 tablets by mouth at bedtime as needed for mild constipation. (Patient taking differently: Take 2 tablets by mouth every 3 (three) days. ) 30 tablet 0  . SYNTHROID 125 MCG tablet Take 125 mcg by mouth daily before breakfast.     . tamsulosin (FLOMAX) 0.4 MG CAPS capsule Take 0.4 mg by mouth daily.    . vitamin B-12  (CYANOCOBALAMIN) 1000 MCG tablet Take 1,000 mcg by mouth daily.      No current facility-administered medications for this visit.     Past Medical History:  Diagnosis Date  . Anemia   . BPH (benign prostatic hyperplasia)   . Coronary artery disease   . GERD (gastroesophageal reflux disease)   . 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 High cholesterol   . Hypertension   . Hypothyroidism   . IBS (irritable bowel syndrome)   . Thyroid disease   . Vitamin D deficiency     Past Surgical History:  Procedure Laterality Date  . CARDIAC CATHETERIZATION    . COLONOSCOPY    . COLONOSCOPY WITH PROPOFOL N/A 10/20/2014   Procedure: COLONOSCOPY WITH PROPOFOL;  Surgeon: Manya Silvas, MD;  Location: [facility] ENDOSCOPY;  Service: Endoscopy;  Laterality: N/A;  . ESOPHAGOGASTRODUODENOSCOPY (EGD) WITH PROPOFOL N/A 10/20/2014   Procedure: ESOPHAGOGASTRODUODENOSCOPY (EGD) WITH PROPOFOL;  Surgeon: Manya Silvas, MD;  Location: [facility] ENDOSCOPY;  Service: Endoscopy;  Laterality: N/A;  . HERNIA REPAIR Right 1996  . INGUINAL HERNIA REPAIR Left 10/20/2018   Procedure: OPEN LEFT HERNIA REPAIR INGUINAL ADULT;  Surgeon: Herbert Pun, MD;  Location: [facility] ORS;  Service: General;  Laterality: Left;  . PTCA of PDA     Social  History  Substance Use Topics  . Smoking status: Never Smoker  . Smokeless tobacco: Never Used  . Alcohol use No    Family History No bleeding or clotting disorders  No Known Allergies   REVIEW OF SYSTEMS (Negative unless checked)  Constitutional: [] ?Weight loss  [] ?Fever  [] ?Chills Cardiac: [] ?Chest pain   [] ?Chest pressure   [] ?Palpitations   [] ?Shortness of breath when laying flat   [] ?Shortness of breath at rest   [] ?Shortness of breath with exertion. Vascular:  [] ?Pain in legs with walking   [] ?Pain in legs at rest   [] ?Pain in legs when laying flat   [] ?Claudication   [] ?Pain in feet when walking  [] ?Pain in feet at rest  [] ?Pain in feet when laying flat    [] ?History of DVT   [] ?Phlebitis   [] ?Swelling in legs   [] ?Varicose veins   [] ?Non-healing ulcers Pulmonary:   [] ?Uses home oxygen   [] ?Productive cough   [] ?Hemoptysis   [] ?Wheeze  [] ?COPD   [] ?Asthma Neurologic:  [] ?Dizziness  [] ?Blackouts   [] ?Seizures   [] ?History of stroke   [] ?History of TIA  [] ?Aphasia   [] ?Temporary blindness   [] ?Dysphagia   [] ?Weakness or numbness in arms   [] ?Weakness or numbness in legs Musculoskeletal:  [] ?Arthritis   [] ?Joint swelling   [] ?Joint pain   [] ?Low back pain Hematologic:  [] ?Easy bruising  [] ?Easy bleeding   [] ?Hypercoagulable state   [x] ?Anemic   Gastrointestinal:  [] ?Blood in stool   [] ?Vomiting blood  [x] ?Gastroesophageal reflux/heartburn   [] ?Abdominal pain Genitourinary:  [] ?Chronic kidney disease   [] ?Difficult urination  [] ?Frequent urination  [] ?Burning with urination   [] ?Hematuria Skin:  [] ?Rashes   [] ?Ulcers   [] ?Wounds Psychological:  [] ?History of anxiety   [] ? History of major depression.    Physical Examination  BP 128/74 (BP Location: Right Arm)   Pulse (!) 57   Resp 16   Wt 172 lb (78 kg)   BMI 23.99 kg/m  Gen:  WD/WN, NAD.  Appears far younger than stated age Head: [place]/AT, No temporalis wasting. Ear/Nose/Throat: Hearing grossly intact, nares w/o erythema or drainage Eyes: Conjunctiva clear. Sclera non-icteric Neck: Supple.  Trachea midline Pulmonary:  Good air movement, no use of accessory muscles.  Cardiac: RRR, no JVD Vascular:  Vessel Right Left  Radial Palpable Palpable                          PT Palpable Palpable  DP Palpable Palpable   Gastrointestinal: soft, non-tender/non-distended. No guarding/reflex.  Musculoskeletal: M/S 5/5 throughout.  No deformity or atrophy.  No edema. Neurologic: Sensation grossly intact in extremities.  Symmetrical.  Speech is fluent.  Psychiatric: Judgment intact, Mood & affect appropriate for pt's clinical situation. Dermatologic: No rashes or ulcers noted.  No cellulitis or  open wounds.       Labs Recent Results (from the past 2160 hour(s))  Basic metabolic panel     Status: Abnormal   Collection Time: 09/01/18  1:56 PM  Result Value Ref Range   Sodium 135 135 - 145 mmol/L   Potassium 3.7 3.5 - 5.1 mmol/L   Chloride 102 98 - 111 mmol/L   CO2 24 22 - 32 mmol/L   Glucose, Bld 102 (H) 70 - 99 mg/dL   BUN 21 8 - 23 mg/dL   Creatinine, Ser 0.95 0.61 - 1.24 mg/dL   Calcium 8.8 (L) 8.9 - 10.3 mg/dL   GFR calc non Af Amer >60 >60 mL/min  GFR calc Af Amer >60 >60 mL/min   Anion gap 9 5 - 15    Comment: Performed at [facility], [address] [place], [place] [address]  CBC     Status: Abnormal   Collection Time: 09/01/18  1:56 PM  Result Value Ref Range   WBC 7.2 4.0 - 10.5 K/uL   RBC 3.84 (L) 4.22 - 5.81 MIL/uL   Hemoglobin 12.5 (L) 13.0 - 17.0 g/dL   HCT 36.8 (L) 39.0 - 52.0 %   MCV 95.8 80.0 - 100.0 fL   MCH 32.6 26.0 - 34.0 pg   MCHC 34.0 30.0 - 36.0 g/dL   RDW 13.4 11.5 - 15.5 %   Platelets 156 150 - 400 K/uL   nRBC 0.0 0.0 - 0.2 %    Comment: Performed at [facility], [place], [place] [address]  Troponin I (High Sensitivity)     Status: None   Collection Time: 09/01/18  1:56 PM  Result Value Ref Range   Troponin I (High Sensitivity) 13 <18 ng/L    Comment: (NOTE) Elevated high sensitivity troponin I (hsTnI) values and significant  changes across serial measurements may suggest ACS but many other  chronic and acute conditions are known to elevate hsTnI results.  Refer to the "Links" section for chest pain algorithms and additional  guidance. Performed at [facility], [place], [place] [address]   Troponin I (High Sensitivity)     Status: None   Collection Time: 09/01/18  3:45 PM  Result Value Ref Range   Troponin I (High Sensitivity) 15 <18 ng/L    Comment: (NOTE) Elevated high sensitivity troponin I (hsTnI) values and significant  changes across serial measurements  may suggest ACS but many other  chronic and acute conditions are known to elevate hsTnI results.  Refer to the "Links" section for chest pain algorithms and additional  guidance. Performed at [facility], [address] [place], [place] [address]   Lipase, blood     Status: None   Collection Time: 09/01/18  3:45 PM  Result Value Ref Range   Lipase 29 11 - 51 U/L    Comment: Performed at [facility], [address] [place], [place] [address]  Hepatic function panel     Status: None   Collection Time: 09/01/18  3:45 PM  Result Value Ref Range   Total Protein 6.9 6.5 - 8.1 g/dL   Albumin 4.2 3.5 - 5.0 g/dL   AST 33 15 - 41 U/L   ALT 24 0 - 44 U/L   Alkaline Phosphatase 52 38 - 126 U/L   Total Bilirubin 0.8 0.3 - 1.2 mg/dL   Bilirubin, Direct 0.1 0.0 - 0.2 mg/dL   Indirect Bilirubin 0.7 0.3 - 0.9 mg/dL    Comment: Performed at [facility], [place], [place] [address]  Troponin I (High Sensitivity)     Status: None   Collection Time: 09/01/18  5:59 PM  Result Value Ref Range   Troponin I (High Sensitivity) 17 <18 ng/L    Comment: (NOTE) Elevated high sensitivity troponin I (hsTnI) values and significant  changes across serial measurements may suggest ACS but many other  chronic and acute conditions are known to elevate hsTnI results.  Refer to the "Links" section for chest pain algorithms and additional  guidance. Performed at [facility], [address], [place], [place] [address]   SARS CORONAVIRUS 2 Nasal Swab  Aptima Multi Swab     Status: None   Collection Time: 10/16/18  3:36 PM   Specimen: Aptima Multi Swab; Nasal Swab  Result Value Ref Range   SARS Coronavirus 2 NEGATIVE NEGATIVE    Comment: (NOTE) SARS-CoV-2 target nucleic acids are NOT DETECTED. The SARS-CoV-2 RNA is generally detectable in upper and lower respiratory specimens during the acute phase of infection. Negative results do not preclude SARS-CoV-2  infection, do not rule out co-infections with other pathogens, and should not be used as the sole basis for treatment or other patient management decisions. Negative results must be combined with clinical observations, patient history, and epidemiological information. The expected result is Negative. Fact Sheet for Patients: SugarRoll.be Fact Sheet for Healthcare Providers: https://www.woods-mathews.com/ This test is not yet approved or cleared by the [place] FDA and  has been authorized for detection and/or diagnosis of SARS-CoV-2 by FDA under an Emergency Use Authorization (EUA). This EUA will remain  in effect (meaning this test can be used) for the duration of the COVID-19 declaration under Section 56 4(b)(1) of the Act, 21 U.S.C. section 360bbb-3(b)(1), unless the authorization is terminated or revoked sooner. Performed at [facility], [place] [address] [place], [place] [address]   Surgical pathology     Status: None   Collection Time: 10/20/18  8:19 AM  Result Value Ref Range   SURGICAL PATHOLOGY      Surgical Pathology CASE: [id number] PATIENT: Pilar Grammes Surgical Pathology Report     SPECIMEN SUBMITTED: A. Hernia sac, left inguinal, and cord lipoma  CLINICAL HISTORY: None provided  PRE-OPERATIVE DIAGNOSIS: K40.90 non recurrent unilateral inguinal hernia w/o obstruction or gangrene  POST-OPERATIVE DIAGNOSIS: K40.90 non recurrent unilateral inguinal hernia     DIAGNOSIS: A. HERNIA SAC, LEFT AND CORD LIPOMA; REPAIR AND EXCISION: - BENIGN HERNIA SAC. - MATURE LOBULATED ADIPOSE TISSUE CONSISTENT WITH LIPOMA.   GROSS DESCRIPTION: A. Labeled: Left inguinal hernia sac and cord lipoma Received: In formalin Tissue fragment(s): 2 Size: 4 x 3 x 1.5 cm Description: Unoriented grossly unremarkable fibromembranous fibroadipose tissue fragment. Representatively submitted in 1 cassette.   Final Diagnosis  performed by Raynelle Bring, MD.   Electronically signed 10/21/2018 3:20:21PM The electronic signature indicates that the named Attending Pathologist h as evaluated the specimen  Technical component performed at [facility], [address], [place], [place] [address] Lab: [phone] Dir: Rush Farmer, MD, MMM  Professional component performed at [facility], [facility], [place], [place], [place] [address] Lab: [phone] Dir: Dellia Nims. Rubinas, MD   Troponin I (High Sensitivity)     Status: None   Collection Time: 10/20/18 10:35 AM  Result Value Ref Range   Troponin I (High Sensitivity) 5 <18 ng/L    Comment: (NOTE) Elevated high sensitivity troponin I (hsTnI) values and significant  changes across serial measurements may suggest ACS but many other  chronic and acute conditions are known to elevate hsTnI results.  Refer to the "Links" section for chest pain algorithms and additional  guidance. Performed at [facility], [address], [place], [place] [address]     Radiology No results found.  Assessment/Plan Hypertension blood pressure control important in reducing the progression of atherosclerotic disease and aneurysmal degeneration. On appropriate oral medications.   High cholesterol lipid control important in reducing the progression of atherosclerotic disease. Continue statin therapy  Iliac artery aneurysm (HCC) Duplex today shows the abdominal aorta to be right at 3 cm in maximal diameter and the left common iliac  artery to be 1.6 cm in maximal diameter.  The sizes really have not changed significantly from his study 2 years ago.  They pose him no immediate risk, but we will continue to follow these on every other year basis at this point.  AAA (abdominal aortic aneurysm) (HCC) Duplex today shows the abdominal aorta to be right at 3 cm in maximal diameter and the left common iliac artery to be 1.6 cm in maximal diameter.     Leotis Pain, MD  11/18/2018 11:06 AM    This note was created with Dragon medical transcription system.  Any errors from dictation are purely unintentional

## 2018-11-18 NOTE — Assessment & Plan Note (Signed)
Duplex today shows the abdominal aorta to be right at 3 cm in maximal diameter and the left common iliac artery to be 1.6 cm in maximal diameter.

## 2019-03-17 ENCOUNTER — Ambulatory Visit: Payer: Medicare PPO | Attending: Internal Medicine

## 2019-03-17 DIAGNOSIS — Z23 Encounter for immunization: Secondary | ICD-10-CM

## 2019-03-17 NOTE — Progress Notes (Signed)
   Covid-19 Vaccination Clinic  Name:  Brad Brady    MRN: [id number] DOB: [birth year]  03/17/2019  Mr. Brad Brady was observed post Covid-19 immunization for 15 minutes without incidence. He was provided with Vaccine Information Sheet and instruction to access the V-Safe system.   Mr. Brad Brady was instructed to call 911 with any severe reactions post vaccine: [name] Kitchen Difficulty breathing  . Swelling of your face and throat  . A fast heartbeat  . A bad rash all over your body  . Dizziness and weakness    Immunizations Administered    Name Date Dose VIS Date Route   Pfizer COVID-19 Vaccine 03/17/2019  4:52 PM 0.3 mL 02/06/2019 Intramuscular   Manufacturer: [place]   Lot: [id number]   [place]: [id number]

## 2019-04-08 ENCOUNTER — Ambulatory Visit: Payer: Medicare PPO | Attending: Internal Medicine

## 2019-04-08 DIAGNOSIS — Z23 Encounter for immunization: Secondary | ICD-10-CM | POA: Insufficient documentation

## 2019-04-08 NOTE — Progress Notes (Signed)
   Covid-19 Vaccination Clinic  Name:  Brad Brady    MRN: [id number] DOB: Jul 03, 1927  04/08/2019  [name] was observed post Covid-19 immunization for 15 minutes without incidence. He was provided with Vaccine Information Sheet and instruction to access the V-Safe system.   [name] was instructed to call 911 with any severe reactions post vaccine: [name] Kitchen Difficulty breathing  . Swelling of your face and throat  . A fast heartbeat  . A bad rash all over your body  . Dizziness and weakness    Immunizations Administered    Name Date Dose VIS Date Route   Pfizer COVID-19 Vaccine 04/08/2019  9:17 AM 0.3 mL 02/06/2019 Intramuscular   Manufacturer: [place]   Lot: [id number]   [place]: [id number]

## 2019-04-21 ENCOUNTER — Other Ambulatory Visit: Payer: Self-pay | Admitting: General Surgery

## 2019-04-21 ENCOUNTER — Other Ambulatory Visit (HOSPITAL_COMMUNITY): Payer: Self-pay | Admitting: General Surgery

## 2019-04-21 DIAGNOSIS — R1032 Left lower quadrant pain: Secondary | ICD-10-CM

## 2019-04-24 ENCOUNTER — Other Ambulatory Visit: Payer: Self-pay

## 2019-04-24 ENCOUNTER — Ambulatory Visit
Admission: RE | Admit: 2019-04-24 | Discharge: 2019-04-24 | Disposition: A | Discharge status: Home / Self Care | Payer: Medicare PPO | Source: Ambulatory Visit | Attending: General Surgery | Admitting: General Surgery

## 2019-04-24 DIAGNOSIS — R1032 Left lower quadrant pain: Secondary | ICD-10-CM | POA: Diagnosis not present

## 2019-05-23 ENCOUNTER — Emergency Department
Admission: EM | Admit: 2019-05-23 | Discharge: 2019-05-23 | Disposition: A | Discharge status: Home / Self Care | Payer: Medicare PPO | Attending: Emergency Medicine | Admitting: Emergency Medicine

## 2019-05-23 ENCOUNTER — Other Ambulatory Visit: Payer: Self-pay

## 2019-05-23 ENCOUNTER — Emergency Department: Payer: Medicare PPO

## 2019-05-23 ENCOUNTER — Encounter: Payer: Self-pay | Admitting: Emergency Medicine

## 2019-05-23 DIAGNOSIS — E039 Hypothyroidism, unspecified: Secondary | ICD-10-CM | POA: Diagnosis not present

## 2019-05-23 DIAGNOSIS — I10 Essential (primary) hypertension: Secondary | ICD-10-CM | POA: Insufficient documentation

## 2019-05-23 DIAGNOSIS — I2510 Atherosclerotic heart disease of native coronary artery without angina pectoris: Secondary | ICD-10-CM | POA: Diagnosis not present

## 2019-05-23 DIAGNOSIS — R079 Chest pain, unspecified: Secondary | ICD-10-CM | POA: Insufficient documentation

## 2019-05-23 DIAGNOSIS — R531 Weakness: Secondary | ICD-10-CM

## 2019-05-23 LAB — CBC
HCT: 34.3 % — ABNORMAL LOW (ref 39.0–52.0)
Hemoglobin: 11.8 g/dL — ABNORMAL LOW (ref 13.0–17.0)
MCH: 33.5 pg (ref 26.0–34.0)
MCHC: 34.4 g/dL (ref 30.0–36.0)
MCV: 97.4 fL (ref 80.0–100.0)
Platelets: 166 10*3/uL (ref 150–400)
RBC: 3.52 MIL/uL — ABNORMAL LOW (ref 4.22–5.81)
RDW: 14.2 % (ref 11.5–15.5)
WBC: 6.7 10*3/uL (ref 4.0–10.5)
nRBC: 0 % (ref 0.0–0.2)

## 2019-05-23 LAB — BASIC METABOLIC PANEL
Anion gap: 8 (ref 5–15)
BUN: 20 mg/dL (ref 8–23)
CO2: 26 mmol/L (ref 22–32)
Calcium: 9 mg/dL (ref 8.9–10.3)
Chloride: 99 mmol/L (ref 98–111)
Creatinine, Ser: 0.8 mg/dL (ref 0.61–1.24)
GFR calc Af Amer: >60 mL/min (ref >60)
GFR calc non Af Amer: >60 mL/min (ref >60)
Glucose, Bld: 112 mg/dL — ABNORMAL HIGH (ref 70–99)
Potassium: 4.4 mmol/L (ref 3.5–5.1)
Sodium: 133 mmol/L — ABNORMAL LOW (ref 135–145)

## 2019-05-23 LAB — TROPONIN I (HIGH SENSITIVITY): Troponin I (High Sensitivity): 7 ng/L (ref <18)

## 2019-05-23 NOTE — ED Notes (Signed)
ED Provider at bedside. 

## 2019-05-23 NOTE — ED Notes (Signed)
First Nurse Note: Pt to ED from Urgent Care for Weakness and fatigue. Pt is in NAD.

## 2019-05-23 NOTE — ED Provider Notes (Signed)
[facility] Emergency Department Provider Note       Time seen: ----------------------------------------- 6:21 PM on 05/23/2019 -----------------------------------------   I have reviewed the triage vital signs and the nursing notes.  HISTORY   Chief Complaint Chest Pain    HPI Brad Brady is a 86 y.o. male with a history of anemia, coronary disease, GERD, hyperlipidemia, hypertension, IBS who presents to the ED for indigestion and upper chest pressure several days ago that he does not currently have.  Patient is complaining of some left-sided inguinal pain from remote hernia surgery.  He has had decreased energy over the past several days.  Otherwise denies complaints.  Past Medical History:  Diagnosis Date  . Anemia   . BPH (benign prostatic hyperplasia)   . Coronary artery disease   . GERD (gastroesophageal reflux disease)   . 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 High cholesterol   . Hypertension   . Hypothyroidism   . IBS (irritable bowel syndrome)   . Thyroid disease   . Vitamin D deficiency     Patient Active Problem List   Diagnosis Date Noted  . Left inguinal hernia 07/16/2018  . Anemia, chronic disease 12/04/2017  . Discomfort of groin, right 12/01/2017  . Bladder wall thickening 11/29/2017  . GERD (gastroesophageal reflux disease) 11/29/2017  . Chest pain 06/21/2017  . Hypertension 10/02/2016  . High cholesterol 10/02/2016  . 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 Iliac artery aneurysm ([place]) 10/02/2016  . Chronic superficial gastritis without bleeding 11/10/2014  . Unstable angina ([place]) 09/05/2014  . ASCVD (arteriosclerotic cardiovascular disease) 09/05/2014  . Abnormal EKG 09/05/2014  . AAA (abdominal aortic aneurysm) ([place]) 12/29/2013  . Chronic back pain 12/29/2013  . Hx of cardiac cath 12/29/2013  . Hypothyroidism 08/31/2013    Past Surgical History:  Procedure Laterality Date  . CARDIAC CATHETERIZATION    .  COLONOSCOPY    . COLONOSCOPY WITH PROPOFOL N/A 10/20/2014   Procedure: COLONOSCOPY WITH PROPOFOL;  Surgeon: Manya Silvas, MD;  Location: [facility] ENDOSCOPY;  Service: Endoscopy;  Laterality: N/A;  . ESOPHAGOGASTRODUODENOSCOPY (EGD) WITH PROPOFOL N/A 10/20/2014   Procedure: ESOPHAGOGASTRODUODENOSCOPY (EGD) WITH PROPOFOL;  Surgeon: Manya Silvas, MD;  Location: [facility] ENDOSCOPY;  Service: Endoscopy;  Laterality: N/A;  . HERNIA REPAIR Right 1996  . INGUINAL HERNIA REPAIR Left 10/20/2018   Procedure: OPEN LEFT HERNIA REPAIR INGUINAL ADULT;  Surgeon: Herbert Pun, MD;  Location: [facility] ORS;  Service: General;  Laterality: Left;  . PTCA of PDA      Allergies Patient has no known allergies.  Social History Social History   Tobacco Use  . Smoking status: Never Smoker  . Smokeless tobacco: Never Used  Substance Use Topics  . Alcohol use: No  . Drug use: No    Review of Systems Constitutional: Negative for fever. Cardiovascular: Negative for chest pain. Respiratory: Negative for shortness of breath. Gastrointestinal: Negative for abdominal pain, vomiting and diarrhea. Musculoskeletal: Negative for back pain. Skin: Negative for rash. Neurological: Positive for generalized weakness  All systems negative/normal/unremarkable except as stated in the HPI  ____________________________________________   PHYSICAL EXAM:  VITAL SIGNS: ED Triage Vitals  Enc Vitals Group     BP 05/23/19 1524 118/73     Pulse Rate 05/23/19 1524 71     Resp 05/23/19 1524 18     Temp 05/23/19 1524 97.7 F (36.5 C)     Temp Source 05/23/19 1524 Oral     SpO2 05/23/19 1524 99 %  Weight 05/23/19 1527 170 lb (77.1 kg)     Height 05/23/19 1527 5\' 10"  (1.778 m)     Head Circumference --      Peak Flow --      Pain Score 05/23/19 1524 0     Pain Loc --      Pain Edu? --      Excl. in [place]? --     Constitutional: Alert and oriented. Well appearing and in no distress. Eyes: Conjunctivae are normal.  Normal extraocular movements. ENT      Head: Normocephalic and atraumatic.      Nose: No congestion/rhinnorhea.      Mouth/Throat: Mucous membranes are moist.      Neck: No stridor. Cardiovascular: Normal rate, regular rhythm. No murmurs, rubs, or gallops. Respiratory: Normal respiratory effort without tachypnea nor retractions. Breath sounds are clear and equal bilaterally. No wheezes/rales/rhonchi. Gastrointestinal: Soft and nontender. Normal bowel sounds Musculoskeletal: Nontender with normal range of motion in extremities. No lower extremity tenderness nor edema. Neurologic:  Normal speech and language. No gross focal neurologic deficits are appreciated.  Skin:  Skin is warm, dry and intact. No rash noted. Psychiatric: Mood and affect are normal. Speech and behavior are normal.  ____________________________________________  EKG: Interpreted by me.  Sinus rhythm with occasional PVCs, rate of 62 bpm, right bundle branch block, normal QT  ____________________________________________  ED COURSE:  As part of my medical decision making, I reviewed the following data within the [place] History obtained from family if available, nursing notes, old chart and ekg, as well as notes from prior ED visits. Patient presented for chest pressure several days ago, we will assess with labs and imaging as indicated at this time.   Procedures  Brad Brady was evaluated in Emergency Department on 05/23/2019 for the symptoms described in the history of present illness. He was evaluated in the context of the global COVID-19 pandemic, which necessitated consideration that the patient might be at risk for infection with the SARS-CoV-2 virus that causes COVID-19. Institutional protocols and algorithms that pertain to the evaluation of patients at risk for COVID-19 are in a state of rapid change based on information released by regulatory bodies including the CDC and federal and state  organizations. These policies and algorithms were followed during the patient's care in the ED.  ____________________________________________   LABS (pertinent positives/negatives)  Labs Reviewed  BASIC METABOLIC PANEL - Abnormal; Notable for the following components:      Result Value   Sodium 133 (*)    Glucose, Bld 112 (*)    All other components within normal limits  CBC - Abnormal; Notable for the following components:   RBC 3.52 (*)    Hemoglobin 11.8 (*)    HCT 34.3 (*)    All other components within normal limits  TROPONIN I (HIGH SENSITIVITY)  TROPONIN I (HIGH SENSITIVITY)    RADIOLOGY Images were viewed by me CXR IMPRESSION: No active cardiopulmonary disease.  ____________________________________________   DIFFERENTIAL DIAGNOSIS   Indigestion, musculoskeletal pain, GERD, unstable angina, MI  FINAL ASSESSMENT AND PLAN  Chest pain   Plan: The patient had presented for chest pressure several days ago. Patient's labs were reassuring. Patient's imaging did not reveal any acute process.  He will be referred to his cardiologist for close outpatient follow-up.  It has been several days since he has had these symptoms.  His concern was that he was still feeling weak with no specific etiology discovered.  Laurence Aly, MD    Note: This note was generated in part or whole with voice recognition software. Voice recognition is usually quite accurate but there are transcription errors that can and very often do occur. I apologize for any typographical errors that were not detected and corrected.     Earleen Newport, MD 05/23/19 [id number]

## 2019-05-23 NOTE — ED Triage Notes (Signed)
Pt arrived via POV with reports of having indigestion/pressure a few days ago, states he has had problems with scar tissue on the left side where he had a hernia surgery, pt states he has bene feeling weak and having decrease energy for the past few days after having the episode of indigestion.  Pt denies any pain at this time. Pt is alert and oriented x 4.  Pt sees Dr. Saralyn Pilar with cardiology.

## 2019-05-23 NOTE — ED Notes (Signed)
Transported to XR  

## 2020-02-04 IMAGING — CT CT ABD-PELV W/ CM
1 of 3 series · 13 of 32 positions shown, 18 images · IV contrast (APPLIED)
Comparison: Abdominal ultrasound 11/13/2005

CLINICAL DATA: Bilateral inguinal swelling. Weight loss. History of
anemia. Prior bilateral inguinal hernia repairs.

EXAM:
CT ABDOMEN AND PELVIS WITH CONTRAST
TECHNIQUE: Multidetector CT imaging of the abdomen and pelvis was performed
using the standard protocol following bolus administration of
intravenous contrast.
CONTRAST:  100mL KHP90T-VNN IOPAMIDOL (KHP90T-VNN) INJECTION 61%

[Series 2: axial st · axial · 0.88mm/px · z∈[-920,-500]mm · 13 of 94 slices shown, 18 images]
[im 5/94  soft-tissue]
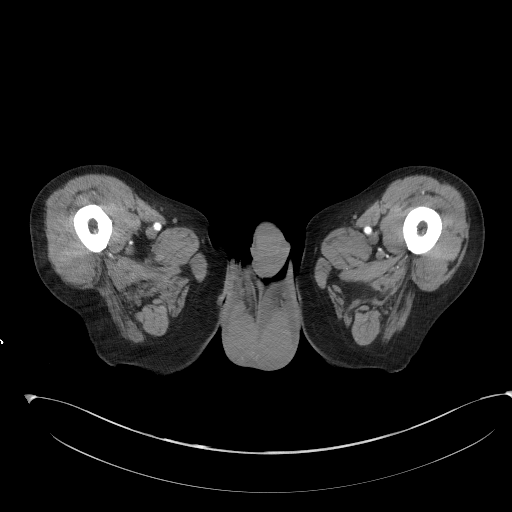
[im 5/94  bone]
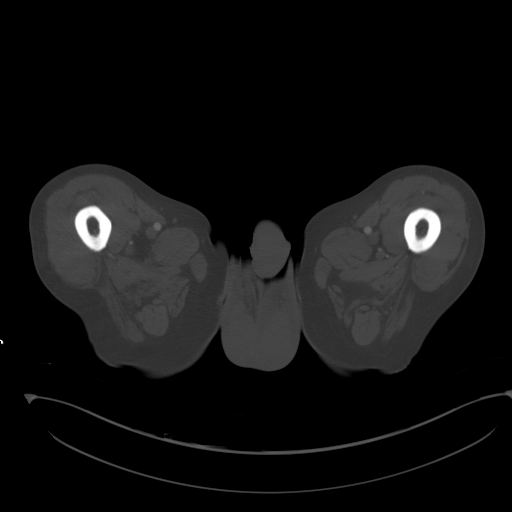
[im 15/94  soft-tissue]
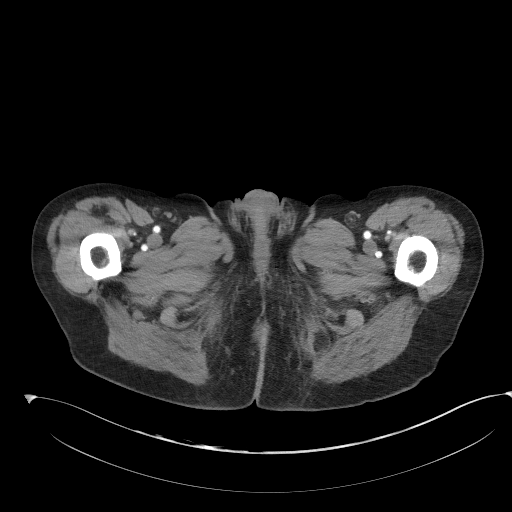
[im 20/94  soft-tissue]
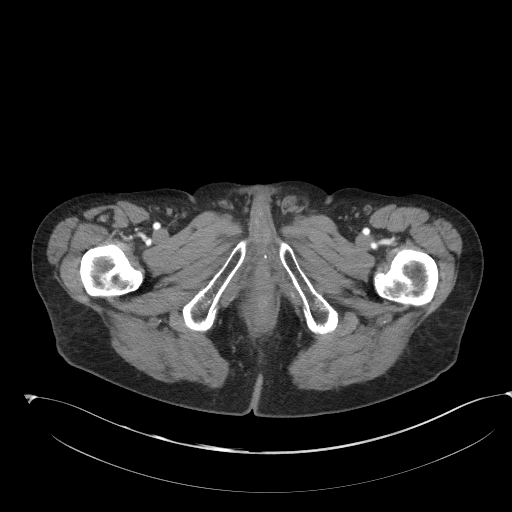
[im 30/94  soft-tissue]
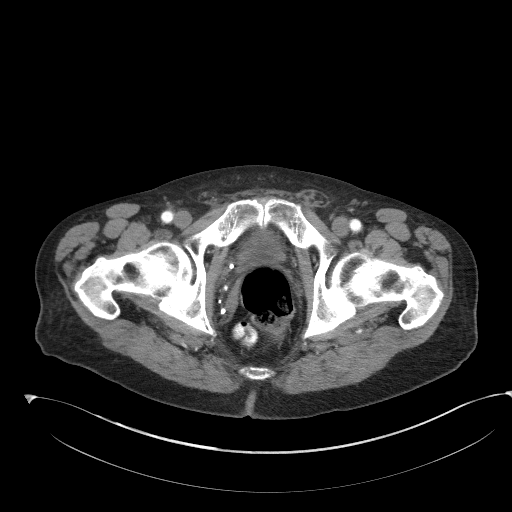
[im 35/94  soft-tissue]
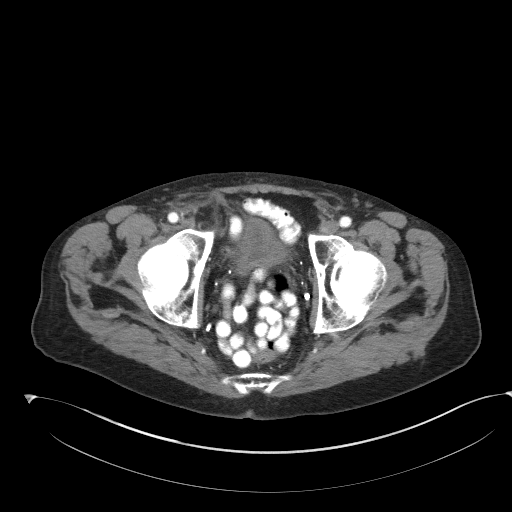
[im 45/94  soft-tissue]
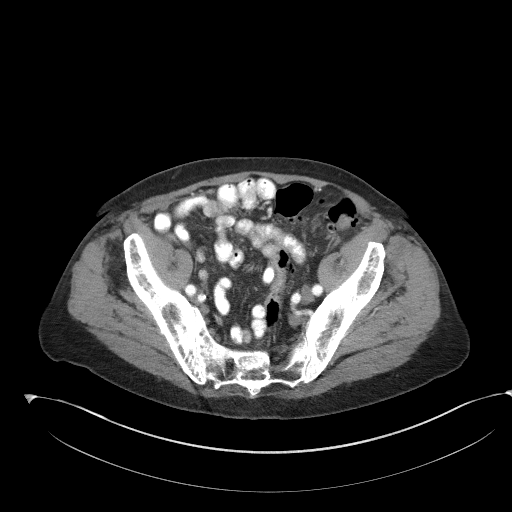
[im 49/94  soft-tissue]
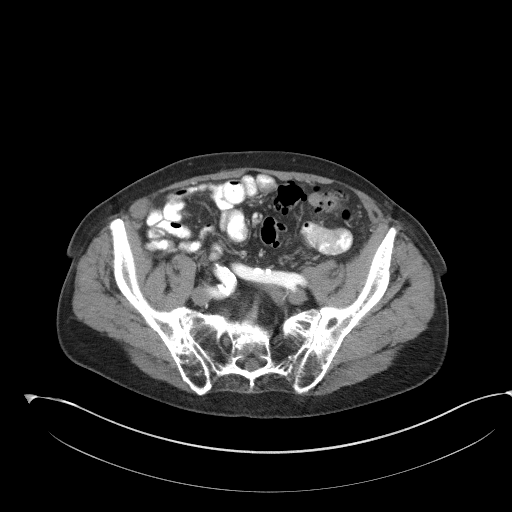
[im 59/94  soft-tissue]
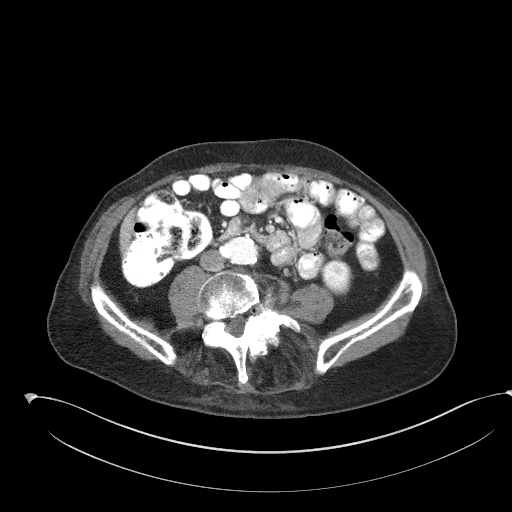
[im 64/94  soft-tissue]
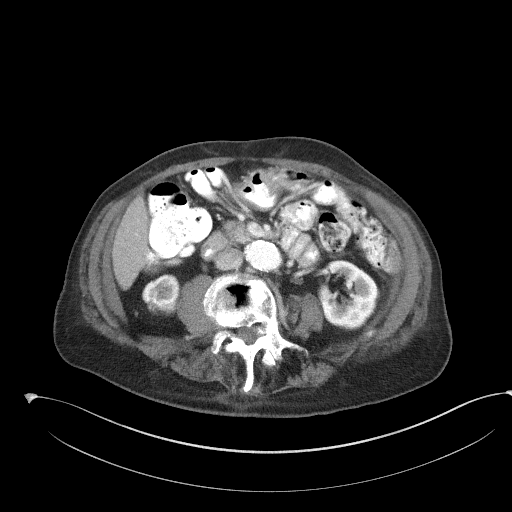
[im 64/94  bone]
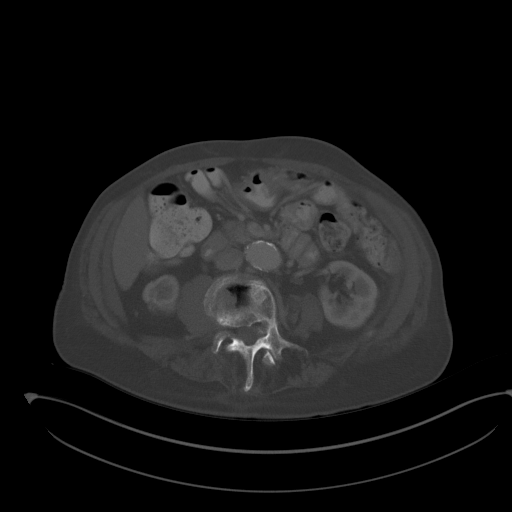
[im 74/94  soft-tissue]
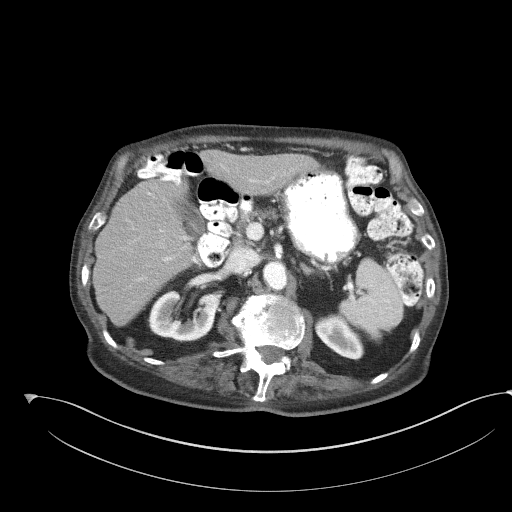
[im 74/94  lung]
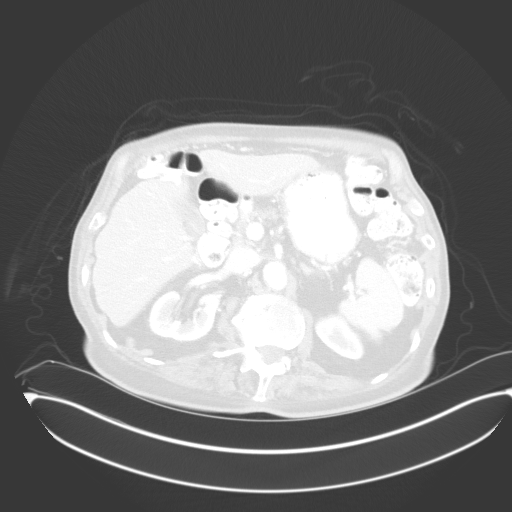
[im 79/94  soft-tissue]
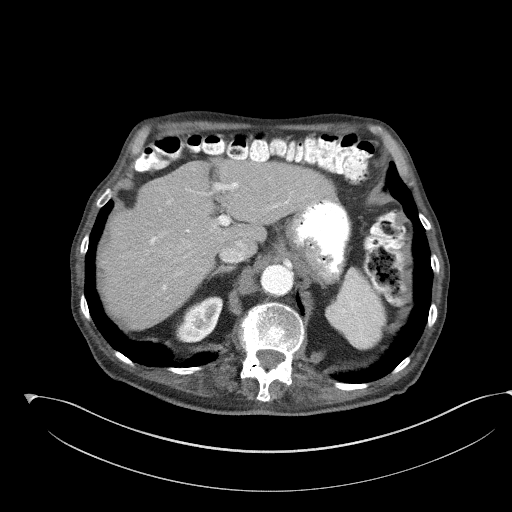
[im 79/94  lung]
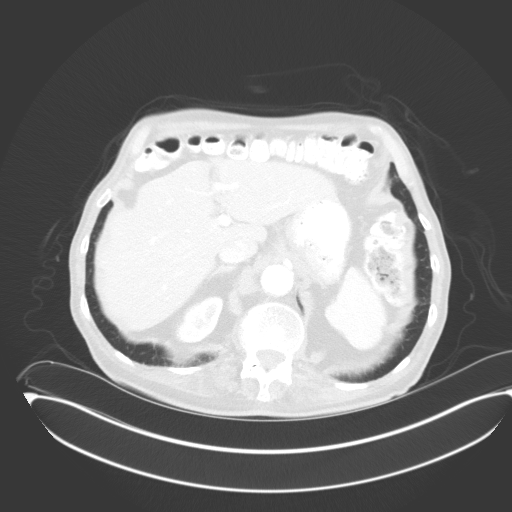
[im 84/94  lung]
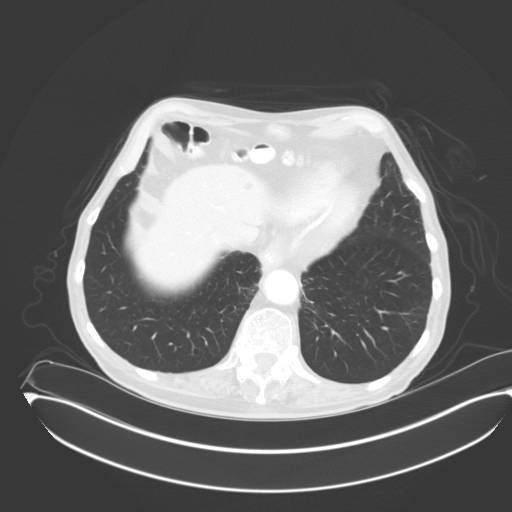
[im 89/94  soft-tissue]
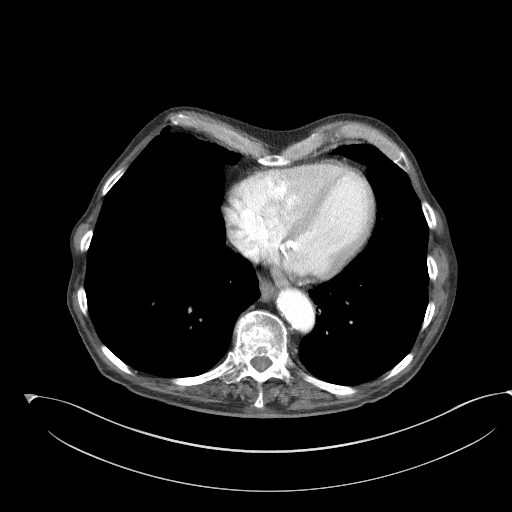
[im 89/94  lung]
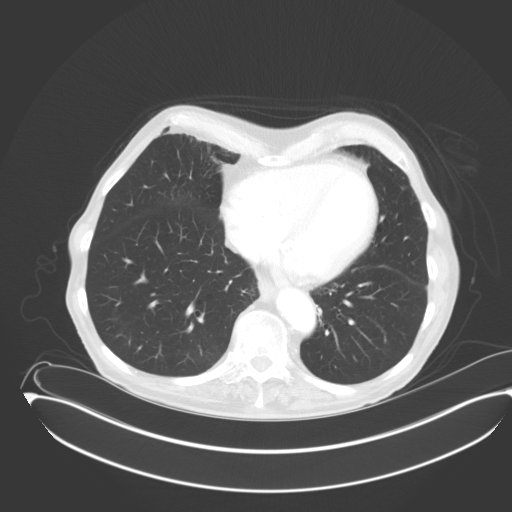

[13 of 32 positions shown; findings below may reference images not displayed]

FINDINGS: Lower chest: Clear lung bases. Normal heart size without pericardial
or pleural effusion. Mitral annular calcifications.

Hepatobiliary: High left hepatic lobe cysts. Normal gallbladder,
without biliary ductal dilatation.

Pancreas: Normal, without mass or ductal dilatation.

Spleen: Normal in size, without focal abnormality.

Adrenals/Urinary Tract: Normal adrenal glands. Mild renal cortical
thinning, within normal variation for age. Bilateral extrarenal
pelves. Primarily decompressed urinary bladder. Apparent mild wall
thickening is likely secondary. Fat stranding adjacent the
right-side of the bladder on image 61/2 is favored to be
postoperative.

Stomach/Bowel: Normal stomach, without wall thickening. Extensive
colonic diverticulosis. Normal terminal ileum and appendix. Normal
small bowel.

Vascular/Lymphatic: Advanced aortic and branch vessel
atherosclerosis. No abdominopelvic adenopathy.

Reproductive: Normal prostate. Moderate bilateral hydroceles,
including on 92/2.

Other: No significant free fluid. Tiny recurrent or residual fat
containing left inguinal hernia, most apparent on coronal image 30.

Musculoskeletal: Lumbosacral spondylosis with grade 1 L5-S1
anterolisthesis. Moderate convex right lumbar spine curvature.
IMPRESSION: 1. Tiny residual or recurrent fat containing left inguinal hernia.
2. Apparent bladder wall thickening, with adjacent pericystic fat
stranding. The wall thickening is favored to be due to
underdistention. The pericystic fat stranding is favored to be
postoperative. Consider correlation with urinalysis to exclude
cystitis.
3.  Aortic Atherosclerosis (UOWIR-NW2.2).
4. Bilateral hydroceles.

## 2020-04-25 ENCOUNTER — Ambulatory Visit: Payer: Medicare PPO | Admitting: Dermatology

## 2020-04-25 ENCOUNTER — Other Ambulatory Visit: Payer: Self-pay

## 2020-04-25 DIAGNOSIS — L578 Other skin changes due to chronic exposure to nonionizing radiation: Secondary | ICD-10-CM

## 2020-04-25 DIAGNOSIS — L814 Other melanin hyperpigmentation: Secondary | ICD-10-CM

## 2020-04-25 DIAGNOSIS — L603 Nail dystrophy: Secondary | ICD-10-CM | POA: Diagnosis not present

## 2020-04-25 DIAGNOSIS — L820 Inflamed seborrheic keratosis: Secondary | ICD-10-CM | POA: Diagnosis not present

## 2020-04-25 DIAGNOSIS — L570 Actinic keratosis: Secondary | ICD-10-CM | POA: Diagnosis not present

## 2020-04-25 DIAGNOSIS — Z1283 Encounter for screening for malignant neoplasm of skin: Secondary | ICD-10-CM

## 2020-04-25 DIAGNOSIS — L821 Other seborrheic keratosis: Secondary | ICD-10-CM

## 2020-04-25 DIAGNOSIS — L853 Xerosis cutis: Secondary | ICD-10-CM

## 2020-04-25 DIAGNOSIS — D229 Melanocytic nevi, unspecified: Secondary | ICD-10-CM

## 2020-04-25 DIAGNOSIS — D1800 Hemangioma unspecified site: Secondary | ICD-10-CM

## 2020-04-25 MED ORDER — MOMETASONE FUROATE 0.1 % EX CREA: 1.0000 "application " | TOPICAL_CREAM | CUTANEOUS | 1 refills | Status: DC

## 2020-04-25 NOTE — Patient Instructions (Addendum)
Seborrheic Keratosis  What causes seborrheic keratoses? Seborrheic keratoses are harmless, common skin growths that first appear during adult life.  As time goes by, more growths appear.  Some people may develop a large number of them.  Seborrheic keratoses appear on both covered and uncovered body parts.  They are not caused by sunlight.  The tendency to develop seborrheic keratoses can be inherited.  They vary in color from skin-colored to gray, brown, or even black.  They can be either smooth or have a rough, warty surface.   Seborrheic keratoses are superficial and look as if they were stuck on the skin.  Under the microscope this type of keratosis looks like layers upon layers of skin.  That is why at times the top layer may seem to fall off, but the rest of the growth remains and re-grows.    Treatment Seborrheic keratoses do not need to be treated, but can easily be removed in the office.  Seborrheic keratoses often cause symptoms when they rub on clothing or jewelry.  Lesions can be in the way of shaving.  If they become inflamed, they can cause itching, soreness, or burning.  Removal of a seborrheic keratosis can be accomplished by freezing, burning, or surgery. If any spot bleeds, scabs, or grows rapidly, please return to have it checked, as these can be an indication of a skin cancer.   Dry Skin Care  What causes dry skin?  Dry skin is common and results from inadequate moisture in the outer skin layers. Dry skin usually results from the excessive loss of moisture from the skin surface. This occurs due to two major factors: 1. Normally the skin's oil glands deposit a layer of oil on the skin's surface. This layer of oil prevents the loss of moisture from the skin. Exposure to soaps, cleaners, solvents, and disinfectants removes this oily film, allowing water to escape. 2. Water loss from the skin increases when the humidity is low. During winter months we spend a lot of time indoors where  the air is heated. Heated air has very low humidity. This also contributes to dry skin.  A tendency for dry skin may accompany such disorders as eczema. Also, as people age, the number of functioning oil glands decreases, and the tendency toward dry skin can be a sensation of skin tightness when emerging from the shower.  How do I manage dry skin?  1. Humidify your environment. This can be accomplished by using a humidifier in your bedroom at night during winter months. 2. Bathing can actually put moisture back into your skin if done right. Take the following steps while bathing to sooth dry skin:  Avoid hot water, which only dries the skin and makes itching worse. Use warm water.  Avoid washcloths or extensive rubbing or scrubbing.  Use mild soaps like unscented Dove, Oil of Olay, Cetaphil, Basis, or CeraVe.  If you take baths rather than showers, rinse off soap residue with clean water before getting out of tub.  Once out of the shower/tub, pat dry gently with a soft towel. Leave your skin damp.  While still damp, apply any medicated ointment/cream you were prescribed to the affected areas. After you apply your medicated ointment/cream, then apply your moisturizer to your whole body.This is the most important step in dry skin care. If this is omitted, your skin will continue to be dry.  The choice of moisturizer is also very important. In general, lotion will not provider enough moisture to severely  dry skin because it is water based. You should use an ointment or cream. Moisturizers should also be unscented. Good choices include Vaseline (plain petrolatum), Aquaphor, Cetaphil, CeraVe, Vanicream, DML Forte, Aveeno moisture, or Eucerin Cream.  Bath oils can be helpful, but do not replace the application of moisturizer after the bath. In addition, they make the tub slippery causing an increased risk for falls. Therefore, we do not recommend their use.

## 2020-04-25 NOTE — Progress Notes (Signed)
Follow-Up Visit   Subjective  Brad Brady is a 87 y.o. male who presents for the following: Upper body skin exam (Hx AKs), check spot (Scalp, ~23m, sore), and Pruritis (Arms, ~61m, itching).   The following portions of the chart were reviewed this encounter and updated as appropriate:       Review of Systems:  No other skin or systemic complaints except as noted in HPI or Assessment and Plan.  Objective  Well appearing patient in no apparent distress; mood and affect are within normal limits.  All skin waist up examined.  Objective  L vertex scalp x 1: Pink firm scaly papule, not tender to touch  Objective  vertex scalp x 3, L ear helix x 1, forehead x 2, nasal dorsum x 1 (7): Pink scaly macules   Objective  bil arms: Xerosis bil arms  Objective  fingernails: Nail dystrophy with lifting, thickening, and discoloration fingernails   Assessment & Plan    Lentigines - Scattered tan macules - Due to sun exposure - Benign-appering, observe - Recommend daily broad spectrum sunscreen SPF 30+ to sun-exposed areas, reapply every 2 hours as needed. - Call for any changes - trunk  Seborrheic Keratoses - Stuck-on, waxy, tan-brown papules and plaques  - Discussed benign etiology and prognosis. - Observe - Call for any changes - trunk, face  Melanocytic Nevi - Tan-brown and/or pink-flesh-colored symmetric macules and papules - Benign appearing on exam today - Observation - Call clinic for new or changing moles - Recommend daily use of broad spectrum spf 30+ sunscreen to sun-exposed areas.  - trunk  Hemangiomas - Red papules - Discussed benign nature - Observe - Call for any changes  Actinic Damage - Chronic, secondary to cumulative UV/sun exposure - diffuse scaly erythematous macules with underlying dyspigmentation - Recommend daily broad spectrum sunscreen SPF 30+ to sun-exposed areas, reapply every 2 hours as needed.  - Call for new or changing  lesions.  Skin cancer screening performed today.   Inflamed seborrheic keratosis L vertex scalp x 1  Vs AK vrs Inflamed Cyst Recheck on f/up Pt to RTC if doesn't clear  Destruction of lesion - L vertex scalp x 1  Destruction method: cryotherapy   Destruction method comment:  Electrodessication Informed consent: discussed and consent obtained   Lesion destroyed using liquid nitrogen: Yes   Region frozen until ice ball extended beyond lesion: Yes   Outcome: patient tolerated procedure well with no complications   Post-procedure details: wound care instructions given   Additional details:  Prior to procedure, discussed risks of blister formation, small wound, skin dyspigmentation, or rare scar following cryotherapy.  AK (actinic keratosis) (7) vertex scalp x 3, L ear helix x 1, forehead x 2, nasal dorsum x 1  Destruction of lesion - vertex scalp x 3, L ear helix x 1, forehead x 2, nasal dorsum x 1  Destruction method: cryotherapy   Informed consent: discussed and consent obtained   Lesion destroyed using liquid nitrogen: Yes   Region frozen until ice ball extended beyond lesion: Yes   Outcome: patient tolerated procedure well with no complications   Post-procedure details: wound care instructions given    Xerosis cutis bil arms  With Pruritus  Start Mometasone cream qd/bid aa arms prn itching  Recommend mild soap and moisturizer qd/bid. Sample of Dove body wash and Eucerin cream given  mometasone (ELOCON) 0.1 % cream - bil arms  Nail dystrophy fingernails  Probable Tinea Unguium, present for years per pt.  Discussed Lamisil PO 63m course, pt declines at this time  Terbinafine Counseling  Terbinafine is an anti-fungal medicine that can be applied to the skin (over the counter) or taken by mouth (prescription) to treat fungal infections. The pill version is often used to treat fungal infections of the nails or scalp. While most people do not have any side effects from  taking terbinafine pills, some possible side effects of the medicine can include taste changes, headache, loss of smell, vision changes, nausea, vomiting, or diarrhea.   Rare side effects can include irritation of the liver, allergic reaction, or decrease in blood counts (which may show up as not feeling well or developing an infection). If you are concerned about any of these side effects, please stop the medicine and call your doctor, or in the case of an emergency such as feeling very unwell, seek immediate medical care.    Return in about 1 year (around 04/25/2021) for UBSE, Hx of AKs.  I, Othelia Pulling, RMA, am acting as scribe for Brendolyn Patty, MD . Documentation: I have reviewed the above documentation for accuracy and completeness, and I agree with the above.  Brendolyn Patty MD

## 2020-09-26 DIAGNOSIS — M40203 Unspecified kyphosis, cervicothoracic region: Secondary | ICD-10-CM | POA: Insufficient documentation

## 2020-11-18 ENCOUNTER — Other Ambulatory Visit (INDEPENDENT_AMBULATORY_CARE_PROVIDER_SITE_OTHER): Payer: Medicare Other

## 2020-11-18 ENCOUNTER — Ambulatory Visit (INDEPENDENT_AMBULATORY_CARE_PROVIDER_SITE_OTHER): Payer: Medicare Other | Admitting: Vascular Surgery

## 2020-11-22 ENCOUNTER — Encounter (INDEPENDENT_AMBULATORY_CARE_PROVIDER_SITE_OTHER): Payer: Self-pay | Admitting: Vascular Surgery

## 2020-11-22 ENCOUNTER — Ambulatory Visit (INDEPENDENT_AMBULATORY_CARE_PROVIDER_SITE_OTHER): Payer: Medicare PPO | Admitting: Vascular Surgery

## 2020-11-22 ENCOUNTER — Other Ambulatory Visit: Payer: Self-pay

## 2020-11-22 ENCOUNTER — Ambulatory Visit (INDEPENDENT_AMBULATORY_CARE_PROVIDER_SITE_OTHER): Payer: Medicare PPO

## 2020-11-22 VITALS — BP 114/69 | HR 60 | Resp 15 | Wt 173.4 lb

## 2020-11-22 DIAGNOSIS — I714 Abdominal aortic aneurysm, without rupture, unspecified: Secondary | ICD-10-CM

## 2020-11-22 DIAGNOSIS — E7800 Pure hypercholesterolemia, unspecified: Secondary | ICD-10-CM

## 2020-11-22 DIAGNOSIS — I10 Essential (primary) hypertension: Secondary | ICD-10-CM

## 2020-11-22 DIAGNOSIS — I723 Aneurysm of iliac artery: Secondary | ICD-10-CM

## 2020-11-22 NOTE — Assessment & Plan Note (Signed)
Duplex today shows a stable 3 cm abdominal aortic aneurysm and proximally 1.5 cm left common iliac artery aneurysm.  No growth from his study 2 years ago.

## 2020-11-22 NOTE — Progress Notes (Signed)
MRN : [id number]  Brad Brady is a 87 y.o. (09-09-1927) male who presents with chief complaint of  Chief Complaint  Patient presents with   Follow-up    Ultrasound follow up  .  History of Present Illness: Patient returns today in follow up of abdominal aortic aneurysm and left common iliac artery aneurysm.  He is doing well today.  He has no complaints.  He continues to be active and vigorous despite his advancing age.  He really does look great for somebody in his 90s.  No aneurysm related symptoms. Specifically, the patient denies new back or abdominal pain, or signs of peripheral embolization. Duplex today shows a stable 3 cm abdominal aortic aneurysm and proximally 1.5 cm left common iliac artery aneurysm.  No growth from his study 2 years ago.  Current Outpatient Medications  Medication Sig Dispense Refill   acetaminophen (TYLENOL) 500 MG tablet Take 500 mg by mouth every 6 (six) hours as needed for moderate pain.      aspirin EC 81 MG tablet Take 81 mg by mouth every other day.     atenolol (TENORMIN) 25 MG tablet Take 1 tablet (25 mg total) by mouth daily. (Patient taking differently: Take 12.5 mg by mouth daily.) 30 tablet 0   atorvastatin (LIPITOR) 20 MG tablet Take 20 mg by mouth at bedtime.     Cholecalciferol (VITAMIN D3) 2000 UNITS capsule Take 2,000 Units by mouth every morning.     dutasteride (AVODART) 0.5 MG capsule Take 0.5 mg by mouth daily.     Ferrous Sulfate (IRON) 325 (65 Fe) MG TABS Take 325 mg by mouth every other day.      mometasone (ELOCON) 0.1 % cream Apply 1 application topically as directed. Qd to bid to arms until itching resolved, then prn flares 50 g 1   Multiple Vitamins-Minerals (PRESERVISION/LUTEIN PO) Take 1 tablet by mouth 2 (two) times daily.      senna-docusate (SENOKOT-S) 8.6-50 MG per tablet Take 2 tablets by mouth at bedtime as needed for mild constipation. (Patient taking differently: Take 2 tablets by mouth every 3 (three) days.) 30 tablet 0    SYNTHROID 125 MCG tablet Take 125 mcg by mouth daily before breakfast.      tamsulosin (FLOMAX) 0.4 MG CAPS capsule Take 0.4 mg by mouth daily.     vitamin B-12 (CYANOCOBALAMIN) 1000 MCG tablet Take 1,000 mcg by mouth daily.      esomeprazole (NEXIUM) 40 MG capsule Take 40 mg by mouth every other day.  (Patient not taking: No sig reported)     No current facility-administered medications for this visit.    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    High cholesterol    Hypertension    Hypothyroidism    IBS (irritable bowel syndrome)    Thyroid disease    Vitamin D deficiency     Past Surgical History:  Procedure Laterality Date   CARDIAC CATHETERIZATION     COLONOSCOPY     COLONOSCOPY WITH PROPOFOL N/A 10/20/2014   Procedure: COLONOSCOPY WITH PROPOFOL;  Surgeon: Manya Silvas, MD;  Location: [place];  Service: Endoscopy;  Laterality: N/A;   ESOPHAGOGASTRODUODENOSCOPY (EGD) WITH PROPOFOL N/A 10/20/2014   Procedure: ESOPHAGOGASTRODUODENOSCOPY (EGD) WITH PROPOFOL;  Surgeon: Manya Silvas, MD;  Location: [place];  Service: Endoscopy;  Laterality: N/A;   HERNIA REPAIR Right 1996   INGUINAL HERNIA REPAIR Left 10/20/2018   Procedure: OPEN LEFT HERNIA REPAIR INGUINAL ADULT;  Surgeon: Herbert Pun, MD;  Location: [facility];  Service: General;  Laterality: Left;   PTCA of PDA          Social History  Substance Use Topics   Smoking status: Never Smoker   Smokeless tobacco: Never Used   Alcohol use No      Family History No bleeding or clotting disorders   No Known Allergies     REVIEW OF SYSTEMS (Negative unless checked)   Constitutional: [] Weight loss  [] Fever  [] Chills Cardiac: [] Chest pain   [] Chest pressure   [] Palpitations   [] Shortness of breath when laying flat   [] Shortness of  breath at rest   [] Shortness of breath with exertion. Vascular:  [] Pain in legs with walking   [] Pain in legs at rest   [] Pain in legs when laying flat   [] Claudication   [] Pain in feet when walking  [] Pain in feet at rest  [] Pain in feet when laying flat   [] History of DVT   [] Phlebitis   [] Swelling in legs   [] Varicose veins   [] Non-healing ulcers Pulmonary:   [] Uses home oxygen   [] Productive cough   [] Hemoptysis   [] Wheeze  [] COPD   [] Asthma Neurologic:  [] Dizziness  [] Blackouts   [] Seizures   [] History of stroke   [] History of TIA  [] Aphasia   [] Temporary blindness   [] Dysphagia   [] Weakness or numbness in arms   [] Weakness or numbness in legs Musculoskeletal:  [] Arthritis   [] Joint swelling   [] Joint pain   [] Low back pain Hematologic:  [] Easy bruising  [] Easy bleeding   [] Hypercoagulable state   [x] Anemic   Gastrointestinal:  [] Blood in stool   [] Vomiting blood  [x] Gastroesophageal reflux/heartburn   [] Abdominal pain Genitourinary:  [] Chronic kidney disease   [] Difficult urination  [] Frequent urination  [] Burning with urination   [] Hematuria Skin:  [] Rashes   [] Ulcers   [] Wounds Psychological:  [] History of anxiety   []  History of major depression.   Physical Examination  BP 114/69 (BP Location: Right Arm)   Pulse 60   Resp 15   Wt 173 lb 6.4 oz (78.7 kg)   BMI 24.88 kg/m  Gen:  WD/WN, NAD. Appears younger than  stated age. Head: [place]/AT, No temporalis wasting. Ear/Nose/Throat: Hearing grossly intact, nares w/o erythema or drainage Eyes: Conjunctiva clear. Sclera non-icteric Neck: Supple.  Trachea midline Pulmonary:  Good air movement, no use of accessory muscles.  Cardiac: RRR, no JVD Vascular:  Vessel Right Left  Radial Palpable Palpable               Musculoskeletal: M/S 5/5 throughout.  No deformity or atrophy.  No edema. Neurologic: Sensation grossly intact in extremities.  Symmetrical.  Speech is fluent.  Psychiatric: Judgment intact, Mood & affect appropriate for pt's  clinical situation. Dermatologic: No rashes or ulcers noted.  No cellulitis or open wounds.      Labs No results found for this or any previous visit (from the past 2160 hour(s)).  Radiology No results found.  Assessment/Plan Hypertension blood pressure control important in reducing the progression of atherosclerotic disease and aneurysmal degeneration. On appropriate oral medications.     High cholesterol lipid control important in reducing the progression of atherosclerotic disease. Continue statin therapy  AAA (abdominal aortic aneurysm) (HCC) Duplex today shows a stable 3 cm abdominal aortic aneurysm  and proximally 1.5 cm left common iliac artery aneurysm.  No growth from his study 2 years ago.  Continue to follow every other year.  Iliac artery aneurysm (HCC) Duplex today shows a stable 3 cm abdominal aortic aneurysm and proximally 1.5 cm left common iliac artery aneurysm.  No growth from his study 2 years ago.    Leotis Pain, MD  11/22/2020 9:37 AM    This note was created with Dragon medical transcription system.  Any errors from dictation are purely unintentional

## 2020-11-22 NOTE — Assessment & Plan Note (Signed)
Duplex today shows a stable 3 cm abdominal aortic aneurysm and proximally 1.5 cm left common iliac artery aneurysm.  No growth from his study 2 years ago.  Continue to follow every other year.

## 2021-05-02 ENCOUNTER — Other Ambulatory Visit: Payer: Self-pay

## 2021-05-02 ENCOUNTER — Ambulatory Visit: Payer: Medicare PPO | Admitting: Dermatology

## 2021-05-02 DIAGNOSIS — L814 Other melanin hyperpigmentation: Secondary | ICD-10-CM

## 2021-05-02 DIAGNOSIS — D229 Melanocytic nevi, unspecified: Secondary | ICD-10-CM

## 2021-05-02 DIAGNOSIS — D692 Other nonthrombocytopenic purpura: Secondary | ICD-10-CM | POA: Diagnosis not present

## 2021-05-02 DIAGNOSIS — L570 Actinic keratosis: Secondary | ICD-10-CM

## 2021-05-02 DIAGNOSIS — D1800 Hemangioma unspecified site: Secondary | ICD-10-CM | POA: Diagnosis not present

## 2021-05-02 DIAGNOSIS — L578 Other skin changes due to chronic exposure to nonionizing radiation: Secondary | ICD-10-CM | POA: Diagnosis not present

## 2021-05-02 DIAGNOSIS — L821 Other seborrheic keratosis: Secondary | ICD-10-CM

## 2021-05-02 DIAGNOSIS — Z872 Personal history of diseases of the skin and subcutaneous tissue: Secondary | ICD-10-CM

## 2021-05-02 DIAGNOSIS — Z1283 Encounter for screening for malignant neoplasm of skin: Secondary | ICD-10-CM | POA: Diagnosis not present

## 2021-05-02 DIAGNOSIS — L853 Xerosis cutis: Secondary | ICD-10-CM

## 2021-05-02 NOTE — Progress Notes (Signed)
? ?Follow-Up Visit ?  ?Subjective  ?Brad Brady is a 88 y.o. male who presents for the following: Follow-up (Patient here today 1 year upper body exam. Patient reports a spot at right upper abdomen and spot at right forehead he would like checked. Patient has history of aks and isks. ). No h/o skin cancer. ? ? ?The patient presents for Upper Body Skin Exam (UBSE) for skin cancer screening and mole check.  The patient has spots, moles and lesions to be evaluated, some may be new or changing and the patient has concerns that these could be cancer. ? ? ? ?The following portions of the chart were reviewed this encounter and updated as appropriate:   ?  ? ?Review of Systems: No other skin or systemic complaints except as noted in HPI or Assessment and Plan. ? ? ?Objective  ?Well appearing patient in no apparent distress; mood and affect are within normal limits. ? ?All skin waist up examined. ? ?left forehead x 2, right postauricular x 1, frontal scalp x 1 (4) ?Erythematous thin papules/macules with gritty scale.  ? ? ?Assessment & Plan  ?Actinic keratosis (4) ?left forehead x 2, right postauricular x 1, frontal scalp x 1 ? ?Vrs ISKs ? ?Actinic keratoses are precancerous spots that appear secondary to cumulative UV radiation exposure/sun exposure over time. They are chronic with expected duration over 1 year. A portion of actinic keratoses will progress to squamous cell carcinoma of the skin. It is not possible to reliably predict which spots will progress to skin cancer and so treatment is recommended to prevent development of skin cancer. ? ?Recommend daily broad spectrum sunscreen SPF 30+ to sun-exposed areas, reapply every 2 hours as needed.  ?Recommend staying in the shade or wearing long sleeves, sun glasses (UVA+UVB protection) and wide brim hats (4-inch brim around the entire circumference of the hat). ?Call for new or changing lesions. ? ?Destruction of lesion - left forehead x 2, right postauricular x 1,  frontal scalp x 1 ? ?Destruction method: cryotherapy   ?Informed consent: discussed and consent obtained   ?Lesion destroyed using liquid nitrogen: Yes   ?Region frozen until ice ball extended beyond lesion: Yes   ?Outcome: patient tolerated procedure well with no complications   ?Post-procedure details: wound care instructions given   ?Additional details:  Prior to procedure, discussed risks of blister formation, small wound, skin dyspigmentation, or rare scar following cryotherapy. Recommend Vaseline ointment to treated areas while healing.  ? ?Lentigines ?- Scattered tan macules ?- Due to sun exposure ?- Benign-appearing, observe ?- Recommend daily broad spectrum sunscreen SPF 30+ to sun-exposed areas, reapply every 2 hours as needed. ?- Call for any changes ? ?Seborrheic Keratoses ?- Stuck-on, waxy, tan-brown papules at right forehead and right abdomen, also numerous on face, scalp ?- Benign-appearing ?- Discussed benign etiology and prognosis. ?- Observe ?- Call for any changes ? ?Purpura - Chronic; persistent and recurrent.  Treatable, but not curable. ?- Violaceous macules and patches at arms  ?- Benign ?- Related to trauma, age, sun damage and/or use of blood thinners, chronic use of topical and/or oral steroids ?- Observe ?- Can use OTC arnica containing moisturizer such as Dermend Bruise Formula if desired ?- Call for worsening or other concerns ? ?Xerosis ?- diffuse xerotic patches ?- recommend gentle, hydrating skin care ?- gentle skin care handout given ? ?Melanocytic Nevi ?- Tan-brown and/or pink-flesh-colored symmetric macules and papules ?- Benign appearing on exam today ?- Observation ?- Call clinic for  new or changing moles ?- Recommend daily use of broad spectrum spf 30+ sunscreen to sun-exposed areas.  ? ?Hemangiomas ?- Red papules ?- Discussed benign nature ?- Observe ?- Call for any changes ? ?Actinic Damage ?- Chronic condition, secondary to cumulative UV/sun exposure ?- diffuse scaly  erythematous macules with underlying dyspigmentation ?- Recommend daily broad spectrum sunscreen SPF 30+ to sun-exposed areas, reapply every 2 hours as needed.  ?- Staying in the shade or wearing long sleeves, sun glasses (UVA+UVB protection) and wide brim hats (4-inch brim around the entire circumference of the hat) are also recommended for sun protection.  ?- Call for new or changing lesions. ? ?Skin cancer screening performed today. ?Return for 1 year upper body exam h/o ak isk. ?I, Ruthell Rummage, CMA, am acting as scribe for Brendolyn Patty, MD. ? ?Documentation: I have reviewed the above documentation for accuracy and completeness, and I agree with the above. ? ?Brendolyn Patty MD  ?

## 2021-05-02 NOTE — Patient Instructions (Addendum)
Actinic keratoses are precancerous spots that appear secondary to cumulative UV radiation exposure/sun exposure over time. They are chronic with expected duration over 1 year. A portion of actinic keratoses will progress to squamous cell carcinoma of the skin. It is not possible to reliably predict which spots will progress to skin cancer and so treatment is recommended to prevent development of skin cancer.  Recommend daily broad spectrum sunscreen SPF 30+ to sun-exposed areas, reapply every 2 hours as needed.  Recommend staying in the shade or wearing long sleeves, sun glasses (UVA+UVB protection) and wide brim hats (4-inch brim around the entire circumference of the hat). Call for new or changing lesions.   Cryotherapy Aftercare  Wash gently with soap and water everyday.   Apply Vaseline and Band-Aid daily until healed.    Seborrheic Keratosis  What causes seborrheic keratoses? Seborrheic keratoses are harmless, common skin growths that first appear during adult life.  As time goes by, more growths appear.  Some people may develop a large number of them.  Seborrheic keratoses appear on both covered and uncovered body parts.  They are not caused by sunlight.  The tendency to develop seborrheic keratoses can be inherited.  They vary in color from skin-colored to gray, brown, or even black.  They can be either smooth or have a rough, warty surface.   Seborrheic keratoses are superficial and look as if they were stuck on the skin.  Under the microscope this type of keratosis looks like layers upon layers of skin.  That is why at times the top layer may seem to fall off, but the rest of the growth remains and re-grows.    Treatment Seborrheic keratoses do not need to be treated, but can easily be removed in the office.  Seborrheic keratoses often cause symptoms when they rub on clothing or jewelry.  Lesions can be in the way of shaving.  If they become inflamed, they can cause itching, soreness,  or burning.  Removal of a seborrheic keratosis can be accomplished by freezing, burning, or surgery. If any spot bleeds, scabs, or grows rapidly, please return to have it checked, as these can be an indication of a skin cancer.  Gentle Skin Care Guide  1. Bathe no more than once a day.  2. Avoid bathing in hot water  3. Use a mild soap like Dove, Vanicream, Cetaphil, CeraVe. Can use Lever 2000 or Cetaphil antibacterial soap  4. Use soap only where you need it. On most days, use it under your arms, between your legs, and on your feet. Let the water rinse other areas unless visibly dirty.  5. When you get out of the bath/shower, use a towel to gently blot your skin dry, don't rub it.  6. While your skin is still a little damp, apply a moisturizing cream such as Vanicream, CeraVe, Cetaphil, Eucerin, Sarna lotion or plain Vaseline Jelly. For hands apply Neutrogena [place] Hand Cream or Excipial Hand Cream.  7. Reapply moisturizer any time you start to itch or feel dry.  8. Sometimes using free and clear laundry detergents can be helpful. Fabric softener sheets should be avoided. Downy Free & Gentle liquid, or any liquid fabric softener that is free of dyes and perfumes, it acceptable to use  9. If your doctor has given you prescription creams you may apply moisturizers over them       Melanoma ABCDEs  Melanoma is the most dangerous type of skin cancer, and is the leading cause of death  from skin disease.  You are more likely to develop melanoma if you: Have light-colored skin, light-colored eyes, or red or blond hair Spend a lot of time in the sun Tan regularly, either outdoors or in a tanning bed Have had blistering sunburns, especially during childhood Have a close family member who has had a melanoma Have atypical moles or large birthmarks  Early detection of melanoma is key since treatment is typically straightforward and cure rates are extremely high if we catch it early.    The first sign of melanoma is often a change in a mole or a new dark spot.  The ABCDE system is a way of remembering the signs of melanoma.  A for asymmetry:  The two halves do not match. B for border:  The edges of the growth are irregular. C for color:  A mixture of colors are present instead of an even brown color. D for diameter:  Melanomas are usually (but not always) greater than 30m - the size of a pencil eraser. E for evolution:  The spot keeps changing in size, shape, and color.  Please check your skin once per month between visits. You can use a small mirror in front and a large mirror behind you to keep an eye on the back side or your body.   If you see any new or changing lesions before your next follow-up, please call to schedule a visit.  Please continue daily skin protection including broad spectrum sunscreen SPF 30+ to sun-exposed areas, reapplying every 2 hours as needed when you're outdoors.   Staying in the shade or wearing long sleeves, sun glasses (UVA+UVB protection) and wide brim hats (4-inch brim around the entire circumference of the hat) are also recommended for sun protection.    If You Need Anything After Your Visit  If you have any questions or concerns for your doctor, please call our main line at [phone] press option 4 to reach your doctor's medical assistant. If no one answers, please leave a voicemail as directed and we will return your call as soon as possible. Messages left after 4 pm will be answered the following business day.   You may also send [place] message via MElida We typically respond to MyChart messages within 1-2 business days.  For prescription refills, please ask your pharmacy to contact our office. Our fax number is [phone]  If you have an urgent issue when the clinic is closed that cannot wait until the next business day, you can page your doctor at the number below.    Please note that while we do our best to be  available for urgent issues outside of office hours, we are not available 24/7.   If you have an urgent issue and are unable to reach [place] you may choose to seek medical care at your doctor's office, retail clinic, urgent care center, or emergency room.  If you have a medical emergency, please immediately call 911 or go to the emergency department.  Pager Numbers  - Dr. KNehemiah Massed [phone] - Dr. MLaurence Ferrari [phone] - Dr. SNicole Kindred [phone] In the event of inclement weather, please call our main line at [phone] an update on the status of any delays or closures.  Dermatology Medication Tips: Please keep the boxes that topical medications come in in order to help keep track of the instructions about where and how to use these. Pharmacies typically print the medication instructions only on the boxes and not  directly on the medication tubes.   If your medication is too expensive, please contact our office at [phone] option 4 or send [place] a message through [place].   We are unable to tell what your co-pay for medications will be in advance as this is different depending on your insurance coverage. However, we may be able to find a substitute medication at lower cost or fill out paperwork to get insurance to cover a needed medication.   If a prior authorization is required to get your medication covered by your insurance company, please allow [place] 1-2 business days to complete this process.  Drug prices often vary depending on where the prescription is filled and some pharmacies may offer cheaper prices.  The website www.goodrx.com contains coupons for medications through different pharmacies. The prices here do not account for what the cost may be with help from insurance (it may be cheaper with your insurance), but the website can give you the price if you did not use any insurance.  - You can print the associated coupon and take it with your prescription to the pharmacy.  -  You may also stop by our office during regular business hours and pick up a GoodRx coupon card.  - If you need your prescription sent electronically to a different pharmacy, notify our office through [facility] or by phone at [phone] option 4.     Si Usted Necesita Algo Despus de Su Visita  Tambin puede enviarnos un mensaje a travs de Pharmacist, community. Por lo general respondemos a los mensajes de MyChart en el transcurso de 1 a 2 das hbiles.  Para renovar recetas, por favor pida a su farmacia que se ponga en contacto con nuestra oficina. Harland Dingwall de fax es [place] [phone].  Si tiene un asunto urgente cuando la clnica est cerrada y que no puede esperar hasta el siguiente da hbil, puede llamar/localizar a su doctor(a) al nmero que aparece a continuacin.   Por favor, tenga en cuenta que aunque hacemos todo lo posible para estar disponibles para asuntos urgentes fuera del horario de [place], no estamos disponibles las 24 horas del da, los 7 das de la [place].   Si tiene un problema urgente y no puede comunicarse con nosotros, puede optar por buscar atencin mdica  en el consultorio de su doctor(a), en una clnica privada, en un centro de atencin urgente o en una sala de emergencias.  Si tiene Engineering geologist, por favor llame inmediatamente al 911 o vaya a la sala de emergencias.  Nmeros de bper  - Dr. Nehemiah Massed: [phone]  - Dra. Moye: [phone]  - Dra. Nicole Kindred: [phone]  En caso de inclemencias del [place], por favor llame a Johnsie Kindred principal al [phone] para una actualizacin sobre el [place] de cualquier retraso o cierre.  Consejos para la medicacin en dermatologa: Por favor, guarde las cajas en las que vienen los medicamentos de uso tpico para ayudarle a seguir las instrucciones sobre dnde y cmo usarlos. Las farmacias generalmente imprimen las instrucciones del medicamento slo en las cajas y no directamente en los tubos del  [place].   Si su medicamento es muy caro, por favor, pngase en contacto con Zigmund Daniel llamando al [phone] y presione la opcin 4 o envenos un mensaje a travs de Pharmacist, community.   No podemos decirle cul ser su copago por los medicamentos por adelantado ya que esto es diferente dependiendo de la cobertura de su seguro. Sin embargo, es posible que podamos encontrar un medicamento  sustituto a Electrical engineer un formulario para que el seguro cubra el medicamento que se considera necesario.   Si se requiere una autorizacin previa para que su compaa de seguros [place] su medicamento, por favor permtanos de 1 a 2 das hbiles para completar este proceso.  Los precios de los medicamentos varan con frecuencia dependiendo del Environmental consultant de dnde se surte la receta y alguna farmacias pueden ofrecer precios ms baratos.  El sitio web www.goodrx.com tiene cupones para medicamentos de Airline pilot. Los precios aqu no tienen en cuenta lo que podra costar con la ayuda del seguro (puede ser ms barato con su seguro), pero el sitio web puede darle el precio si no utiliz Research scientist (physical sciences).  - Puede imprimir el cupn correspondiente y llevarlo con su receta a la farmacia.  - Tambin puede pasar por nuestra oficina durante el horario de atencin regular y Charity fundraiser una tarjeta de cupones de GoodRx.  - Si necesita que su receta se enve electrnicamente a una farmacia diferente, informe a nuestra oficina a travs de MyChart de [place] o por telfono llamando al [phone] y presione la opcin 4.

## 2021-06-14 ENCOUNTER — Ambulatory Visit: Payer: Medicare PPO | Admitting: Dermatology

## 2021-10-24 ENCOUNTER — Other Ambulatory Visit: Payer: Self-pay | Admitting: Physical Medicine & Rehabilitation

## 2021-10-24 ENCOUNTER — Other Ambulatory Visit (HOSPITAL_COMMUNITY): Payer: Self-pay | Admitting: Physical Medicine & Rehabilitation

## 2021-10-24 DIAGNOSIS — M542 Cervicalgia: Secondary | ICD-10-CM

## 2021-10-24 DIAGNOSIS — M5441 Lumbago with sciatica, right side: Secondary | ICD-10-CM

## 2021-10-29 ENCOUNTER — Ambulatory Visit
Admission: RE | Admit: 2021-10-29 | Discharge: 2021-10-29 | Disposition: A | Discharge status: Home / Self Care | Payer: Medicare PPO | Source: Ambulatory Visit | Attending: Physical Medicine & Rehabilitation | Admitting: Physical Medicine & Rehabilitation

## 2021-10-29 DIAGNOSIS — M542 Cervicalgia: Secondary | ICD-10-CM

## 2021-10-29 DIAGNOSIS — M5442 Lumbago with sciatica, left side: Secondary | ICD-10-CM | POA: Diagnosis present

## 2021-10-29 DIAGNOSIS — M5441 Lumbago with sciatica, right side: Secondary | ICD-10-CM | POA: Diagnosis present

## 2021-11-01 ENCOUNTER — Other Ambulatory Visit: Payer: Self-pay | Admitting: Physical Medicine & Rehabilitation

## 2021-11-01 DIAGNOSIS — M9931 Osseous stenosis of neural canal of cervical region: Secondary | ICD-10-CM

## 2021-11-07 ENCOUNTER — Ambulatory Visit
Admission: RE | Admit: 2021-11-07 | Discharge: 2021-11-07 | Disposition: A | Discharge status: Home / Self Care | Payer: Medicare PPO | Source: Ambulatory Visit | Attending: Physical Medicine & Rehabilitation | Admitting: Physical Medicine & Rehabilitation

## 2021-11-07 DIAGNOSIS — M9931 Osseous stenosis of neural canal of cervical region: Secondary | ICD-10-CM

## 2021-11-07 MED ORDER — IOPAMIDOL (ISOVUE-M 300) INJECTION 61%
1.0000 mL | Freq: Once | INTRAMUSCULAR | Status: AC
  Administered 2021-11-07: 1 mL via EPIDURAL

## 2021-11-07 MED ORDER — TRIAMCINOLONE ACETONIDE 40 MG/ML IJ SUSP (RADIOLOGY)
60.0000 mg | Freq: Once | INTRAMUSCULAR | Status: AC
  Administered 2021-11-07: 60 mg via EPIDURAL

## 2021-11-07 NOTE — Discharge Instructions (Signed)
Post Procedure Spinal Discharge Instruction Sheet  You may resume a regular diet and any medications that you routinely take (including pain medications) unless otherwise noted by MD.  No driving day of procedure.  Light activity throughout the rest of the day.  Do not do any strenuous work, exercise, bending or lifting.  The day following the procedure, you can resume normal physical activity but you should refrain from exercising or physical therapy for at least three days thereafter.  You may apply ice to the injection site, 20 minutes on, 20 minutes off, as needed. Do not apply ice directly to skin.    Common Side Effects:  Headaches- take your usual medications as directed by your physician.  Increase your fluid intake.  Caffeinated beverages may be helpful.  Lie flat in bed until your headache resolves.  Restlessness or inability to sleep- you may have trouble sleeping for the next few days.  Ask your referring physician if you need any medication for sleep.  Facial flushing or redness- should subside within a few days.  Increased pain- a temporary increase in pain a day or two following your procedure is not unusual.  Take your pain medication as prescribed by your referring physician.  Leg cramps  Please contact our office at [phone] for the following symptoms: Fever greater than 100 degrees. Headaches unresolved with medication after 2-3 days. Increased swelling, pain, or redness at injection site.   Thank you for visiting [facility] today.     You may resume Aspirin today.

## 2021-12-25 ENCOUNTER — Encounter (INDEPENDENT_AMBULATORY_CARE_PROVIDER_SITE_OTHER): Payer: Self-pay

## 2021-12-26 ENCOUNTER — Emergency Department: Payer: Medicare PPO

## 2021-12-26 ENCOUNTER — Emergency Department
Admission: EM | Admit: 2021-12-26 | Discharge: 2021-12-26 | Disposition: A | Discharge status: Home / Self Care | Payer: Medicare PPO | Attending: Emergency Medicine | Admitting: Emergency Medicine

## 2021-12-26 ENCOUNTER — Other Ambulatory Visit: Payer: Self-pay

## 2021-12-26 DIAGNOSIS — R1032 Left lower quadrant pain: Secondary | ICD-10-CM

## 2021-12-26 DIAGNOSIS — K5903 Drug induced constipation: Secondary | ICD-10-CM | POA: Insufficient documentation

## 2021-12-26 LAB — CBC
HCT: 38.2 % — ABNORMAL LOW (ref 39.0–52.0)
Hemoglobin: 12.6 g/dL — ABNORMAL LOW (ref 13.0–17.0)
MCH: 32.1 pg (ref 26.0–34.0)
MCHC: 33 g/dL (ref 30.0–36.0)
MCV: 97.4 fL (ref 80.0–100.0)
Platelets: 184 10*3/uL (ref 150–400)
RBC: 3.92 MIL/uL — ABNORMAL LOW (ref 4.22–5.81)
RDW: 13.5 % (ref 11.5–15.5)
WBC: 9.8 10*3/uL (ref 4.0–10.5)
nRBC: 0 % (ref 0.0–0.2)

## 2021-12-26 LAB — COMPREHENSIVE METABOLIC PANEL
ALT: 22 U/L (ref 0–44)
AST: 40 U/L (ref 15–41)
Albumin: 4.1 g/dL (ref 3.5–5.0)
Alkaline Phosphatase: 53 U/L (ref 38–126)
Anion gap: 9 (ref 5–15)
BUN: 18 mg/dL (ref 8–23)
CO2: 23 mmol/L (ref 22–32)
Calcium: 9.2 mg/dL (ref 8.9–10.3)
Chloride: 102 mmol/L (ref 98–111)
Creatinine, Ser: 0.85 mg/dL (ref 0.61–1.24)
GFR, Estimated: >60 mL/min (ref >60)
Glucose, Bld: 96 mg/dL (ref 70–99)
Potassium: 3.9 mmol/L (ref 3.5–5.1)
Sodium: 134 mmol/L — ABNORMAL LOW (ref 135–145)
Total Bilirubin: 1 mg/dL (ref 0.3–1.2)
Total Protein: 7 g/dL (ref 6.5–8.1)

## 2021-12-26 LAB — LIPASE, BLOOD: Lipase: 57 U/L — ABNORMAL HIGH (ref 11–51)

## 2021-12-26 NOTE — ED Provider Triage Note (Signed)
  Emergency Medicine Provider Triage Evaluation Note  Brad Brady , a 88 y.o.male,  was evaluated in triage.  Pt complains of abdominal pain/constipation.  Patient states that he has not had a bowel movement in 11 days.  He states that he did do an enema today, with mild success, but overall still feels very constipated.  Reports some lower abdominal pain.   Review of Systems  Positive: Abdominal pain, constipation Negative: Denies fever, chest pain, vomiting  Physical Exam   Vitals:   12/26/21 1645  BP: 137/71  Pulse: (!) 53  Resp: 18  Temp: 98.4 F (36.9 C)  SpO2: 95%   Gen:   Awake, no distress   Resp:  Normal effort  MSK:   Moves extremities without difficulty  Other:    Medical Decision Making  Given the patient's initial medical screening exam, the following diagnostic evaluation has been ordered. The patient will be placed in the appropriate treatment space, once one is available, to complete the evaluation and treatment. I have discussed the plan of care with the patient and I have advised the patient that an ED physician or mid-level practitioner will reevaluate their condition after the test results have been received, as the results may give them additional insight into the type of treatment they may need.    Diagnostics: Labs, abdominal CT, UA  Treatments: none immediately   Teodoro Spray, [place] 12/26/21 1657

## 2021-12-26 NOTE — ED Provider Notes (Signed)
[facility] Provider Note    Event Date/Time   First MD Initiated Contact with Patient 12/26/21 1732     (approximate)   History   Abdominal Pain and Constipation   HPI  Brad Brady is a 88 y.o. male presents to the emergency department with abdominal pain.  Endorses abdominal pain that has been ongoing for the past couple of weeks.  States that he has not had a bowel movement in the past 10 days.  Does state that he has been taking tramadol for his back pain for the past 3 weeks.  States that he self discontinued this 3 days ago whenever he thought it may be causing his constipation.  Passing flatus.  States that he has had prior hernia repairs.  States that anytime he picks up anything he has significant pain for the next 5 days.  Last was evaluated by surgery 6 months ago.  Denies any nausea or vomiting.  States has not had a colonoscopy in many years.      Physical Exam   Triage Vital Signs: ED Triage Vitals  Enc Vitals Group     BP 12/26/21 1645 137/71     Pulse Rate 12/26/21 1645 (!) 53     Resp 12/26/21 1645 18     Temp 12/26/21 1645 98.4 F (36.9 C)     Temp src --      SpO2 12/26/21 1645 95 %     Weight --      Height --      Head Circumference --      Peak Flow --      Pain Score 12/26/21 1646 7     Pain Loc --      Pain Edu? --      Excl. in [place]? --     Most recent vital signs: Vitals:   12/26/21 1800 12/26/21 1900  BP: 127/78 130/82  Pulse: 84 82  Resp: 16 16  Temp:  98.6 F (37 C)  SpO2: 100% 100%    Physical Exam Constitutional:      Appearance: He is well-developed.  HENT:     Head: Atraumatic.  Eyes:     Conjunctiva/sclera: Conjunctivae normal.  Cardiovascular:     Rate and Rhythm: Regular rhythm.  Pulmonary:     Effort: No respiratory distress.  Abdominal:     Tenderness: There is abdominal tenderness in the left lower quadrant.  Genitourinary:    Comments: Rectal exam with no stool in the rectal vault.  No  gross blood or melena. Musculoskeletal:     Cervical back: Normal range of motion.  Skin:    General: Skin is warm.  Neurological:     Mental Status: He is alert. Mental status is at baseline.          IMPRESSION / MDM / ASSESSMENT AND PLAN / ED COURSE  I reviewed the triage vital signs and the nursing notes.   Differential diagnosis including constipation, impaction, small bowel obstruction, hernia, thyroid disorder, medication side effect, malignancy   Lab work with mildly elevated lipase.  Creatinine at baseline.  No significant electrolyte abnormalities.  On chart review of outside records patient has had his thyroid checked.  Checked within the past 3 months do not feel that repeat thyroid check is necessary at this time.  EKG    RADIOLOGY I independently reviewed imaging, my interpretation of imaging: CT scan without signs of a small bowel obstruction.  No contrast was  used.  No obvious hernia.  Read as no acute findings.  No obvious small bowel obstruction.      ED Results / Procedures / Treatments   Labs (all labs ordered are listed, but only abnormal results are displayed) Labs interpreted as -   Offered the patient pain medication however he declined.  Discussed staying hydrated and drink plenty of fluids.  Discussed MiraLAX for his constipation and discussed following up with general surgery and his primary care physician.  Discussed that his underlying abdominal pain could be from a malignancy given that he has not had any recent colonoscopies and encouraged to follow-up as an outpatient.  Given return precautions for any worsening symptoms.  Labs Reviewed  LIPASE, BLOOD - Abnormal; Notable for the following components:      Result Value   Lipase 57 (*)    All other components within normal limits  COMPREHENSIVE METABOLIC PANEL - Abnormal; Notable for the following components:   Sodium 134 (*)    All other components within normal limits  CBC - Abnormal;  Notable for the following components:   RBC 3.92 (*)    Hemoglobin 12.6 (*)    HCT 38.2 (*)    All other components within normal limits  URINALYSIS, ROUTINE W REFLEX MICROSCOPIC       PROCEDURES:  Critical Care performed: No  Procedures  Patient's presentation is most consistent with acute presentation with potential threat to life or bodily function.   MEDICATIONS ORDERED IN ED: Medications - No data to display  FINAL CLINICAL IMPRESSION(S) / ED DIAGNOSES   Final diagnoses:  Drug-induced constipation  Left lower quadrant abdominal pain     Rx / DC Orders   ED Discharge Orders     None        Note:  This document was prepared using Dragon voice recognition software and may include unintentional dictation errors.   Nathaniel Man, MD 12/27/21 0000

## 2021-12-26 NOTE — ED Triage Notes (Signed)
Pt to ED via POV from home. Pt reports abdominal pain and constipation x11 days. Pt reports OTC laxatives with no relief. Pt did an enema today and stated was able to pass a little stool.

## 2021-12-26 NOTE — Discharge Instructions (Signed)
You were seen in the emergency department for abdominal pain.  Your lab work was normal.  You had a CT scan that did not show any signs of a bowel obstruction.  For constipation -take MiraLAX (4 capfulls) mixed with 32 ounces of fluid.  Drink this in the morning.  If you do not have a bowel movement that day you can repeat the next day and had 2 capfuls for a full 6 capfuls of MiraLAX.  MiraLAX does not work if you do not drink plenty of fluids with it.\  Follow-up closely with your primary care physician.  Given your ongoing abdominal pain, you have not had a recent colonoscopy or follow-up with your general surgeon.  You may need to be reevaluated for possible colonoscopy to evaluate for other causes of your abdominal pain.  You can take 1 g of Tylenol (2 extra strength) every 6 hours as needed for abdominal pain.  Concerned that the tramadol that you are taking for the past 3 weeks could be worsening your constipation.  Stop this medication and see if it helps her constipation.

## 2022-04-20 ENCOUNTER — Ambulatory Visit: Payer: Medicare PPO | Admitting: Urology

## 2022-04-30 ENCOUNTER — Emergency Department: Payer: Medicare PPO

## 2022-04-30 ENCOUNTER — Emergency Department
Admission: EM | Admit: 2022-04-30 | Discharge: 2022-04-30 | Disposition: A | Discharge status: Home / Self Care | Payer: Medicare PPO | Attending: Emergency Medicine | Admitting: Emergency Medicine

## 2022-04-30 DIAGNOSIS — I10 Essential (primary) hypertension: Secondary | ICD-10-CM | POA: Diagnosis not present

## 2022-04-30 DIAGNOSIS — W19XXXA Unspecified fall, initial encounter: Secondary | ICD-10-CM

## 2022-04-30 DIAGNOSIS — W01198A Fall on same level from slipping, tripping and stumbling with subsequent striking against other object, initial encounter: Secondary | ICD-10-CM | POA: Diagnosis not present

## 2022-04-30 DIAGNOSIS — S0101XA Laceration without foreign body of scalp, initial encounter: Secondary | ICD-10-CM | POA: Insufficient documentation

## 2022-04-30 DIAGNOSIS — I2510 Atherosclerotic heart disease of native coronary artery without angina pectoris: Secondary | ICD-10-CM | POA: Diagnosis not present

## 2022-04-30 DIAGNOSIS — Z23 Encounter for immunization: Secondary | ICD-10-CM | POA: Insufficient documentation

## 2022-04-30 DIAGNOSIS — S0990XA Unspecified injury of head, initial encounter: Secondary | ICD-10-CM | POA: Diagnosis present

## 2022-04-30 MED ORDER — LIDOCAINE-EPINEPHRINE 2 %-1:100000 IJ SOLN
20.0000 mL | Freq: Once | INTRAMUSCULAR | Status: AC
  Administered 2022-04-30: 20 mL
  Filled 2022-04-30: qty 1

## 2022-04-30 MED ORDER — TETANUS-DIPHTH-ACELL PERTUSSIS 5-2.5-18.5 LF-MCG/0.5 IM SUSY
0.5000 mL | PREFILLED_SYRINGE | Freq: Once | INTRAMUSCULAR | Status: AC
  Administered 2022-04-30: 0.5 mL via INTRAMUSCULAR
  Filled 2022-04-30: qty 0.5

## 2022-04-30 NOTE — ED Triage Notes (Signed)
Pt presents to the ED via POV due to a fall that happened today. Pt denies LOC. Pt states he takes '81mg'$  ASA every other day. Pt denies increasing neck pain. Pt states the back of his head hit the door jam. Pt unknown about last tetanus. Pt A&Ox4

## 2022-04-30 NOTE — ED Notes (Signed)
Per MD [facility] request supported patient with towels and with saline cleaned patient laceration on back of head.

## 2022-04-30 NOTE — ED Notes (Signed)
Assisted MD [place] with patient while he put in 6 staples to patients laceration in back of head.

## 2022-04-30 NOTE — ED Provider Notes (Signed)
[facility] Provider Note    Event Date/Time   First MD Initiated Contact with Patient 04/30/22 1347     (approximate)   History   Chief Complaint Fall   HPI  Brad Brady is a 89 y.o. male with past medical history of hypertension, hyperlipidemia, CAD, GERD, anemia, and AAA who presents to the ED following fall.  Patient reports that he has a longstanding issue with his balance, had attempted to get up to answer the doorbell at the same time as answering the phone earlier this afternoon.  He lost his balance and fell backwards, striking his head on the threshold of a door.  He noticed bleeding from the back of his head following the fall, denies any loss of consciousness and does not take a blood thinner.  He denies significant headache or neck pain, denies any injuries to his trunk or extremities.     Physical Exam   Triage Vital Signs: ED Triage Vitals  Enc Vitals Group     BP 04/30/22 1258 (!) 151/70     Pulse Rate 04/30/22 1258 60     Resp 04/30/22 1258 18     Temp 04/30/22 1258 98.2 F (36.8 C)     Temp Source 04/30/22 1258 Oral     SpO2 04/30/22 1258 100 %     Weight 04/30/22 1259 173 lb 8 oz (78.7 kg)     Height 04/30/22 1259 '5\' 10"'$  (1.778 m)     Head Circumference --      Peak Flow --      Pain Score 04/30/22 1259 2     Pain Loc --      Pain Edu? --      Excl. in [place]? --     Most recent vital signs: Vitals:   04/30/22 1407 04/30/22 1502  BP: (!) 161/88 (!) 155/80  Pulse: 60 61  Resp: 16 17  Temp: 98.2 F (36.8 C) 98.1 F (36.7 C)  SpO2: 100% 99%    Constitutional: Alert and oriented. Eyes: Conjunctivae are normal. Head: 6 cm vertical linear laceration to posterior scalp with no active bleeding.  No scalp hematomas or step-offs. Nose: No congestion/rhinnorhea. Mouth/Throat: Mucous membranes are moist.  Neck: No midline cervical spine tenderness to palpation. Cardiovascular: Normal rate, regular rhythm. Grossly normal heart  sounds.  2+ radial pulses bilaterally. Respiratory: Normal respiratory effort.  No retractions. Lungs CTAB.  No chest wall tenderness to palpation. Gastrointestinal: Soft and nontender. No distention. Musculoskeletal: No lower extremity tenderness nor edema.  No upper extremity bony tenderness to palpation. Neurologic:  Normal speech and language. No gross focal neurologic deficits are appreciated.    ED Results / Procedures / Treatments   Labs (all labs ordered are listed, but only abnormal results are displayed) Labs Reviewed - No data to display  RADIOLOGY CT head reviewed and interpreted by me with no hemorrhage or midline shift.  PROCEDURES:  Critical Care performed: No  ..Laceration Repair  Date/Time: 04/30/2022 3:06 PM  Performed by: Blake Divine, MD Authorized by: Blake Divine, MD   Consent:    Consent obtained:  Verbal   Consent given by:  Patient   Risks, benefits, and alternatives were discussed: yes   Universal protocol:    Patient identity confirmed:  Verbally with patient and arm band Anesthesia:    Anesthesia method:  Local infiltration   Local anesthetic:  Lidocaine 2% WITH epi Laceration details:    Location:  Scalp   Scalp  location:  Occipital   Length (cm):  6 Pre-procedure details:    Preparation:  Patient was prepped and draped in usual sterile fashion and imaging obtained to evaluate for foreign bodies Exploration:    Limited defect created (wound extended): no     Hemostasis achieved with:  Direct pressure   Imaging obtained comment:  CT   Imaging outcome: foreign body not noted     Wound exploration: wound explored through full range of motion and entire depth of wound visualized     Wound extent: areolar tissue not violated, fascia not violated, no foreign body, no signs of injury, no nerve damage, no tendon damage, no underlying fracture and no vascular damage     Contaminated: no   Treatment:    Area cleansed with:  Saline   Amount of  cleaning:  Standard   Irrigation solution:  Sterile saline   Irrigation method:  Pressure wash   Debridement:  None   Undermining:  None   Scar revision: no   Skin repair:    Repair method:  Staples   Number of staples:  6 Approximation:    Approximation:  Close Repair type:    Repair type:  Simple Post-procedure details:    Dressing:  Non-adherent dressing   Procedure completion:  Tolerated well, no immediate complications    MEDICATIONS ORDERED IN ED: Medications  lidocaine-EPINEPHrine (XYLOCAINE W/EPI) 2 %-1:100000 (with pres) injection 20 mL (20 mLs Infiltration Given 04/30/22 1448)  Tdap (BOOSTRIX) injection 0.5 mL (0.5 mLs Intramuscular Given 04/30/22 1413)     IMPRESSION / MDM / [place] / ED COURSE  I reviewed the triage vital signs and the nursing notes.                              89 y.o. male with past medical history of hypertension, hyperlipidemia, CAD, GERD, anemia, and AAA who presents to the ED after losing his balance and falling backwards, striking his head just prior to arrival.  Patient's presentation is most consistent with acute complicated illness / injury requiring diagnostic workup.  Differential diagnosis includes, but is not limited to, intracranial injury, cervical spine injury, scalp hematoma, scalp laceration.  Patient nontoxic-appearing and in no acute distress, vital signs are unremarkable.  Patient reports that he lost his balance causing him to fall, this is not unusual for him and he has no focal neurologic deficits on exam.  CT head and cervical spine are negative for acute process, no evidence of traumatic injury to his trunk or extremities.  Wound was irrigated with saline and patient's tetanus was updated.  Wound repaired with 6 staples and he is appropriate for discharge home with outpatient follow-up for staple removal.  He was counseled to return to the ED for new or worsening symptoms, patient agrees with plan.      FINAL  CLINICAL IMPRESSION(S) / ED DIAGNOSES   Final diagnoses:  Fall, initial encounter  Laceration of scalp, initial encounter     Rx / DC Orders   ED Discharge Orders     None        Note:  This document was prepared using Dragon voice recognition software and may include unintentional dictation errors.   Blake Divine, MD 04/30/22 [phone]

## 2022-04-30 NOTE — ED Notes (Signed)
See triage note. Pt came to ED due to losing balance and falling backwards. Says he hit his head but did not lose any consciousness and denies any pain at this point. A&Ox4. Takes baby asprin every other day. Pt is unaware of when his last tetanus shot was. Has laceration to back of the head approximately 2 inches long was bandaged but is not currently bleeding much.

## 2022-04-30 NOTE — ED Triage Notes (Signed)
Bleeding controlled in triage

## 2022-05-07 ENCOUNTER — Ambulatory Visit: Payer: Medicare PPO | Admitting: Dermatology

## 2022-05-07 DIAGNOSIS — L570 Actinic keratosis: Secondary | ICD-10-CM

## 2022-05-07 DIAGNOSIS — D1801 Hemangioma of skin and subcutaneous tissue: Secondary | ICD-10-CM

## 2022-05-07 DIAGNOSIS — D229 Melanocytic nevi, unspecified: Secondary | ICD-10-CM

## 2022-05-07 DIAGNOSIS — L814 Other melanin hyperpigmentation: Secondary | ICD-10-CM

## 2022-05-07 DIAGNOSIS — Z872 Personal history of diseases of the skin and subcutaneous tissue: Secondary | ICD-10-CM | POA: Diagnosis not present

## 2022-05-07 DIAGNOSIS — Z1283 Encounter for screening for malignant neoplasm of skin: Secondary | ICD-10-CM

## 2022-05-07 DIAGNOSIS — L821 Other seborrheic keratosis: Secondary | ICD-10-CM

## 2022-05-07 DIAGNOSIS — S0100XA Unspecified open wound of scalp, initial encounter: Secondary | ICD-10-CM

## 2022-05-07 DIAGNOSIS — D692 Other nonthrombocytopenic purpura: Secondary | ICD-10-CM

## 2022-05-07 DIAGNOSIS — L853 Xerosis cutis: Secondary | ICD-10-CM

## 2022-05-07 DIAGNOSIS — L578 Other skin changes due to chronic exposure to nonionizing radiation: Secondary | ICD-10-CM | POA: Diagnosis not present

## 2022-05-07 DIAGNOSIS — T1490XD Injury, unspecified, subsequent encounter: Secondary | ICD-10-CM

## 2022-05-07 NOTE — Progress Notes (Signed)
Follow-Up Visit   Subjective  Brad Brady is a 89 y.o. male who presents for the following: UBSE (The patient presents for Upper Body Skin Exam (UBSE) for skin cancer screening and mole check.  The patient has spots, moles and lesions to be evaluated, some may be new or changing and the patient has concerns that these could be cancer. Patient with hx of AK's. ).   The following portions of the chart were reviewed this encounter and updated as appropriate:       Review of Systems:  No other skin or systemic complaints except as noted in HPI or Assessment and Plan.  Objective  Well appearing patient in no apparent distress; mood and affect are within normal limits.  All skin waist up examined.  upper forehead x 1, R temple x 2, R mandible x 1, L lateral cheek x 1 (5) Erythematous thin papules/macules with gritty scale.   vertex scalp Linear heme crusted plaque with staples    Assessment & Plan  AK (actinic keratosis) (5) upper forehead x 1, R temple x 2, R mandible x 1, L lateral cheek x 1  Actinic keratoses are precancerous spots that appear secondary to cumulative UV radiation exposure/sun exposure over time. They are chronic with expected duration over 1 year. A portion of actinic keratoses will progress to squamous cell carcinoma of the skin. It is not possible to reliably predict which spots will progress to skin cancer and so treatment is recommended to prevent development of skin cancer.  Recommend daily broad spectrum sunscreen SPF 30+ to sun-exposed areas, reapply every 2 hours as needed.  Recommend staying in the shade or wearing long sleeves, sun glasses (UVA+UVB protection) and wide brim hats (4-inch brim around the entire circumference of the hat). Call for new or changing lesions.  Hypertrophic vs ISK's  Destruction of lesion - upper forehead x 1, R temple x 2, R mandible x 1, L lateral cheek x 1  Destruction method: cryotherapy   Informed consent: discussed  and consent obtained   Lesion destroyed using liquid nitrogen: Yes   Region frozen until ice ball extended beyond lesion: Yes   Outcome: patient tolerated procedure well with no complications   Post-procedure details: wound care instructions given   Additional details:  Prior to procedure, discussed risks of blister formation, small wound, skin dyspigmentation, or rare scar following cryotherapy. Recommend Vaseline ointment to treated areas while healing.   Healing wound vertex scalp  Patient had fall 04/30/22 and had staples applied  Recommend Vaseline 1-2x daily once staples removed.    Lentigines - Scattered tan macules - Due to sun exposure - Benign-appearing, observe - Recommend daily broad spectrum sunscreen SPF 30+ to sun-exposed areas, reapply every 2 hours as needed. - Call for any changes  Seborrheic Keratoses - Stuck-on, waxy, tan-brown papules and/or plaques  - Benign-appearing - Discussed benign etiology and prognosis. - Observe - Call for any changes  Melanocytic Nevi - Tan-brown and/or pink-flesh-colored symmetric macules and papules - Benign appearing on exam today - Observation - Call clinic for new or changing moles - Recommend daily use of broad spectrum spf 30+ sunscreen to sun-exposed areas.   Hemangiomas - Red papules - Discussed benign nature - Observe - Call for any changes  Actinic Damage - Chronic condition, secondary to cumulative UV/sun exposure - diffuse scaly erythematous macules with underlying dyspigmentation - Recommend daily broad spectrum sunscreen SPF 30+ to sun-exposed areas, reapply every 2 hours as needed.  -  Staying in the shade or wearing long sleeves, sun glasses (UVA+UVB protection) and wide brim hats (4-inch brim around the entire circumference of the hat) are also recommended for sun protection.  - Call for new or changing lesions.  Skin cancer screening performed today.  History of PreCancerous Actinic Keratosis  -  site(s) of PreCancerous Actinic Keratosis clear today. - these may recur and new lesions may form requiring treatment to prevent transformation into skin cancer - observe for new or changing spots and contact [place] for appointment if occur - photoprotection with sun protective clothing; sunglasses and broad spectrum sunscreen with SPF of at least 30 + and frequent self skin exams recommended - yearly exams by a dermatologist recommended for persons with history of PreCancerous Actinic Keratoses  Xerosis - diffuse xerotic patches - recommend gentle, hydrating skin care - gentle skin care handout given  Purpura - Chronic; persistent and recurrent.  Treatable, but not curable. - Violaceous macules and patches - Benign - Related to trauma, age, sun damage and/or use of blood thinners, chronic use of topical and/or oral steroids - Observe - Can use OTC arnica containing moisturizer such as Dermend Bruise Formula if desired - Call for worsening or other concerns  Return in about 1 year (around 05/07/2023) for UBSE, Hx AK.  Graciella Belton, RMA, am acting as scribe for Brendolyn Patty, MD .  Documentation: I have reviewed the above documentation for accuracy and completeness, and I agree with the above.  Brendolyn Patty MD

## 2022-05-07 NOTE — Patient Instructions (Addendum)
Cryotherapy Aftercare  Wash gently with soap and water everyday.   Apply Vaseline and Band-Aid daily until healed.    Melanoma ABCDEs  Melanoma is the most dangerous type of skin cancer, and is the leading cause of death from skin disease.  You are more likely to develop melanoma if you: Have light-colored skin, light-colored eyes, or red or blond hair Spend a lot of time in the sun Tan regularly, either outdoors or in a tanning bed Have had blistering sunburns, especially during childhood Have a close family member who has had a melanoma Have atypical moles or large birthmarks  Early detection of melanoma is key since treatment is typically straightforward and cure rates are extremely high if we catch it early.   The first sign of melanoma is often a change in a mole or a new dark spot.  The ABCDE system is a way of remembering the signs of melanoma.  A for asymmetry:  The two halves do not match. B for border:  The edges of the growth are irregular. C for color:  A mixture of colors are present instead of an even brown color. D for diameter:  Melanomas are usually (but not always) greater than 6mm - the size of a pencil eraser. E for evolution:  The spot keeps changing in size, shape, and color.  Please check your skin once per month between visits. You can use a small mirror in front and a large mirror behind you to keep an eye on the back side or your body.   If you see any new or changing lesions before your next follow-up, please call to schedule a visit.  Please continue daily skin protection including broad spectrum sunscreen SPF 30+ to sun-exposed areas, reapplying every 2 hours as needed when you're outdoors.    Due to recent changes in healthcare laws, you may see results of your pathology and/or laboratory studies on MyChart before the doctors have had a chance to review them. We understand that in some cases there may be results that are confusing or concerning to you.  Please understand that not all results are received at the same time and often the doctors may need to interpret multiple results in order to provide you with the best plan of care or course of treatment. Therefore, we ask that you please give us 2 business days to thoroughly review all your results before contacting the office for clarification. Should we see a critical lab result, you will be contacted sooner.   If You Need Anything After Your Visit  If you have any questions or concerns for your doctor, please call our main line at [phone] and press option 4 to reach your doctor's medical assistant. If no one answers, please leave a voicemail as directed and we will return your call as soon as possible. Messages left after 4 pm will be answered the following business day.   You may also send us a message via MyChart. We typically respond to MyChart messages within 1-2 business days.  For prescription refills, please ask your pharmacy to contact our office. Our fax number is [phone].  If you have an urgent issue when the clinic is closed that cannot wait until the next business day, you can page your doctor at the number below.    Please note that while we do our best to be available for urgent issues outside of office hours, we are not available 24/7.   If you have an urgent   issue and are unable to reach us, you may choose to seek medical care at your doctor's office, retail clinic, urgent care center, or emergency room.  If you have a medical emergency, please immediately call 911 or go to the emergency department.  Pager Numbers  - Dr. Kowalski: [phone]  - Dr. Moye: [phone]  - Dr. Stewart: [phone]  In the event of inclement weather, please call our main line at [phone] for an update on the status of any delays or closures.  Dermatology Medication Tips: Please keep the boxes that topical medications come in in order to help keep track of the  instructions about where and how to use these. Pharmacies typically print the medication instructions only on the boxes and not directly on the medication tubes.   If your medication is too expensive, please contact our office at [phone] option 4 or send us a message through MyChart.   We are unable to tell what your co-pay for medications will be in advance as this is different depending on your insurance coverage. However, we may be able to find a substitute medication at lower cost or fill out paperwork to get insurance to cover a needed medication.   If a prior authorization is required to get your medication covered by your insurance company, please allow us 1-2 business days to complete this process.  Drug prices often vary depending on where the prescription is filled and some pharmacies may offer cheaper prices.  The website www.goodrx.com contains coupons for medications through different pharmacies. The prices here do not account for what the cost may be with help from insurance (it may be cheaper with your insurance), but the website can give you the price if you did not use any insurance.  - You can print the associated coupon and take it with your prescription to the pharmacy.  - You may also stop by our office during regular business hours and pick up a GoodRx coupon card.  - If you need your prescription sent electronically to a different pharmacy, notify our office through [facility] MyChart or by phone at [phone] option 4.     Si Usted Necesita Algo Despus de Su Visita  Tambin puede enviarnos un mensaje a travs de MyChart. Por lo general respondemos a los mensajes de MyChart en el transcurso de 1 a 2 das hbiles.  Para renovar recetas, por favor pida a su farmacia que se ponga en contacto con nuestra oficina. Nuestro nmero de fax es el [phone].  Si tiene un asunto urgente cuando la clnica est cerrada y que no puede esperar hasta el siguiente da hbil,  puede llamar/localizar a su doctor(a) al nmero que aparece a continuacin.   Por favor, tenga en cuenta que aunque hacemos todo lo posible para estar disponibles para asuntos urgentes fuera del horario de oficina, no estamos disponibles las 24 horas del da, los 7 das de la semana.   Si tiene un problema urgente y no puede comunicarse con nosotros, puede optar por buscar atencin mdica  en el consultorio de su doctor(a), en una clnica privada, en un centro de atencin urgente o en una sala de emergencias.  Si tiene una emergencia mdica, por favor llame inmediatamente al 911 o vaya a la sala de emergencias.  Nmeros de bper  - Dr. Kowalski: [phone]  - Dra. Moye: [phone]  - Dra. Stewart: [phone]  En caso de inclemencias del tiempo, por favor llame a nuestra lnea principal al [phone] para una actualizacin   sobre el estado de cualquier retraso o cierre.  Consejos para la medicacin en dermatologa: Por favor, guarde las cajas en las que vienen los medicamentos de uso tpico para ayudarle a seguir las instrucciones sobre dnde y cmo usarlos. Las farmacias generalmente imprimen las instrucciones del medicamento slo en las cajas y no directamente en los tubos del medicamento.   Si su medicamento es muy caro, por favor, pngase en contacto con nuestra oficina llamando al [phone] y presione la opcin 4 o envenos un mensaje a travs de MyChart.   No podemos decirle cul ser su copago por los medicamentos por adelantado ya que esto es diferente dependiendo de la cobertura de su seguro. Sin embargo, es posible que podamos encontrar un medicamento sustituto a menor costo o llenar un formulario para que el seguro cubra el medicamento que se considera necesario.   Si se requiere una autorizacin previa para que su compaa de seguros cubra su medicamento, por favor permtanos de 1 a 2 das hbiles para completar este proceso.  Los precios de los medicamentos varan con  frecuencia dependiendo del lugar de dnde se surte la receta y alguna farmacias pueden ofrecer precios ms baratos.  El sitio web www.goodrx.com tiene cupones para medicamentos de diferentes farmacias. Los precios aqu no tienen en cuenta lo que podra costar con la ayuda del seguro (puede ser ms barato con su seguro), pero el sitio web puede darle el precio si no utiliz ningn seguro.  - Puede imprimir el cupn correspondiente y llevarlo con su receta a la farmacia.  - Tambin puede pasar por nuestra oficina durante el horario de atencin regular y recoger una tarjeta de cupones de GoodRx.  - Si necesita que su receta se enve electrnicamente a una farmacia diferente, informe a nuestra oficina a travs de MyChart de [place] o por telfono llamando al [phone] y presione la opcin 4.  

## 2022-05-18 ENCOUNTER — Ambulatory Visit: Payer: Medicare PPO | Admitting: Urology

## 2022-07-24 ENCOUNTER — Ambulatory Visit: Payer: Medicare PPO | Admitting: Urology

## 2022-07-24 VITALS — BP 95/60 | HR 71 | Ht 70.0 in | Wt 161.4 lb

## 2022-07-24 DIAGNOSIS — R351 Nocturia: Secondary | ICD-10-CM

## 2022-07-24 DIAGNOSIS — N401 Enlarged prostate with lower urinary tract symptoms: Secondary | ICD-10-CM | POA: Diagnosis not present

## 2022-07-24 LAB — BLADDER SCAN AMB NON-IMAGING: Scan Result: 20

## 2022-07-24 LAB — URINALYSIS, COMPLETE
Bilirubin, UA: NEGATIVE
Glucose, UA: NEGATIVE
Leukocytes,UA: NEGATIVE
Nitrite, UA: NEGATIVE
RBC, UA: NEGATIVE
Specific Gravity, UA: 1.015 (ref 1.005–1.030)
Urobilinogen, Ur: 1 mg/dL (ref 0.2–1.0)
pH, UA: 5.5 (ref 5.0–7.5)

## 2022-07-24 LAB — MICROSCOPIC EXAMINATION

## 2022-07-24 MED ORDER — DUTASTERIDE 0.5 MG PO CAPS: 0.5000 mg | ORAL_CAPSULE | Freq: Every day | ORAL | 3 refills | Status: DC

## 2022-07-24 MED ORDER — TAMSULOSIN HCL 0.4 MG PO CAPS: 0.4000 mg | ORAL_CAPSULE | Freq: Every day | ORAL | 3 refills | Status: DC

## 2022-07-24 NOTE — Progress Notes (Signed)
I,Amy L Pierron,acting as a scribe for Vanna Scotland, MD.,have documented all relevant documentation on the behalf of Vanna Scotland, MD,as directed by  Vanna Scotland, MD while in the presence of Vanna Scotland, MD.  07/24/2022 12:14 PM   Brad Brady 19, 1929 [id number]  Referring provider: Gracelyn Nurse, MD [address] [place],  [place] [address]  Chief Complaint  Patient presents with   Benign Prostatic Hypertrophy    HPI: 89 year-old male with a personal history of BPH with nocturia presents today to establish care.  He was last seen by Dr. Evelene Croon in October of 2023. He's managed on Dutasteride and Flomax. His PSA was last checked in 2021 and was low at 0.6. He had a negative rectal exam in 2022.   He was unable to give a urine sample today. He mentions he tends to drink less water earlier in the day than he should. Therefore, he drinks more in the evening.   He has had a couple hernia surgeries recently. He reports a personal history of back and neck injuries.   He is taking care of his wife who has dementia. He has help during the day, but it is just him at night. He gets up about every hour either to help her to the restroom or because he has urgency to void. He probably gets [place] about 5 times each night. He denies any burning or hematuria.  Last year when he saw Dr. Evelene Croon, he is getting up 2-3 times at night.  He previously was experiencing constipation. It is resolved now because he takes MiraLax.    Results for orders placed or performed in visit on 07/24/22  Bladder Scan (Post Void Residual) in office  Result Value Ref Range   Scan Result 20 ml     IPSS     Row Name 07/24/22 1000         International Prostate Symptom Score   How often have you had the sensation of not emptying your bladder? Not at All     How often have you had to urinate less than every two hours? Almost always     How often have you found you stopped and started again several  times when you urinated? Not at All     How often have you found it difficult to postpone urination? Almost always     How often have you had a weak urinary stream? Not at All     How often have you had to strain to start urination? Not at All     How many times did you typically get up at night to urinate? 5 Times     Total IPSS Score 15       Quality of Life due to urinary symptoms   If you were to spend the rest of your life with your urinary condition just the way it is now how would you feel about that? Mixed            Score:  1-7 Mild 8-19 Moderate 20-35 Severe  PMH: 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    High cholesterol    Hypertension    Hypothyroidism    IBS (irritable bowel syndrome)    Thyroid disease    Vitamin D deficiency  Surgical History: Past Surgical History:  Procedure Laterality Date   CARDIAC CATHETERIZATION     COLONOSCOPY     COLONOSCOPY WITH PROPOFOL N/A 10/20/2014   Procedure: COLONOSCOPY WITH PROPOFOL;  Surgeon: Scot Jun, MD;  Location: [facility] ENDOSCOPY;  Service: Endoscopy;  Laterality: N/A;   ESOPHAGOGASTRODUODENOSCOPY (EGD) WITH PROPOFOL N/A 10/20/2014   Procedure: ESOPHAGOGASTRODUODENOSCOPY (EGD) WITH PROPOFOL;  Surgeon: Scot Jun, MD;  Location: [facility] ENDOSCOPY;  Service: Endoscopy;  Laterality: N/A;   HERNIA REPAIR Right 1996   INGUINAL HERNIA REPAIR Left 10/20/2018   Procedure: OPEN LEFT HERNIA REPAIR INGUINAL ADULT;  Surgeon: Carolan Shiver, MD;  Location: [facility] ORS;  Service: General;  Laterality: Left;   PTCA of PDA      Home Medications:  Allergies as of 07/24/2022   No Known Allergies      Medication List        Accurate as of Brady 28, 2024 12:14 PM. If you have any questions, ask your nurse or doctor.          STOP taking these  medications    esomeprazole 40 MG capsule Commonly known as: NEXIUM   mometasone 0.1 % cream Commonly known as: ELOCON   senna-docusate 8.6-50 MG tablet Commonly known as: Senokot-S       TAKE these medications    acetaminophen 500 MG tablet Commonly known as: TYLENOL Take 500 mg by mouth every 6 (six) hours as needed for moderate pain.   aspirin EC 81 MG tablet Take 81 mg by mouth every other day.   atenolol 25 MG tablet Commonly known as: TENORMIN Take 1 tablet (25 mg total) by mouth daily. What changed: how much to take   atorvastatin 20 MG tablet Commonly known as: LIPITOR Take 20 mg by mouth at bedtime.   cyanocobalamin 1000 MCG tablet Commonly known as: VITAMIN B12 Take 1,000 mcg by mouth daily.   dutasteride 0.5 MG capsule Commonly known as: AVODART Take 0.5 mg by mouth daily.   Iron 325 (65 Fe) MG Tabs Take 325 mg by mouth every other day.   polyethylene glycol 17 g packet Commonly known as: MIRALAX / GLYCOLAX Take 17 g by mouth daily.   PRESERVISION/LUTEIN PO Take 1 tablet by mouth 2 (two) times daily.   Synthroid 125 MCG tablet Generic drug: levothyroxine Take 125 mcg by mouth daily before breakfast.   tamsulosin 0.4 MG Caps capsule Commonly known as: FLOMAX Take 0.4 mg by mouth daily.   Vitamin D3 50 MCG (2000 UT) capsule Take 2,000 Units by mouth every morning.        Family History: Family History  Problem Relation Age of Onset   Multiple myeloma Father     Social History:  reports that he has never smoked. He has never used smokeless tobacco. He reports that he does not drink alcohol and does not use drugs.   Physical Exam: BP 95/60   Pulse 71   Ht 5\' 10"  (1.778 m)   Wt 161 lb 6 oz (73.2 kg)   BMI 23.15 kg/m   Constitutional:  Alert and oriented, No acute distress. HEENT: [place] AT, moist mucus membranes.  Trachea midline, no masses. Neurologic: Grossly intact, no focal deficits, moving all 4 extremities. Psychiatric:  Normal mood and affect.  Results for orders placed or performed in visit on 07/24/22  Microscopic Examination   Urine  Result Value Ref Range   WBC, UA 0-5 0 - 5 /hpf   RBC, Urine 0-2 0 - 2 /hpf  Epithelial Cells (non renal) 0-10 0 - 10 /hpf   Casts Present (A) None seen /lpf   Cast Type Hyaline casts N/A   Mucus, UA Present (A) Not Estab.   Bacteria, UA Moderate (A) None seen/Few  Urinalysis, Complete  Result Value Ref Range   Specific Gravity, UA 1.015 1.005 - 1.030   pH, UA 5.5 5.0 - 7.5   Color, UA Yellow Yellow   Appearance Ur Clear Clear   Leukocytes,UA Negative Negative   Protein,UA Trace (A) Negative/Trace   Glucose, UA Negative Negative   Ketones, UA Trace (A) Negative   RBC, UA Negative Negative   Bilirubin, UA Negative Negative   Urobilinogen, Ur 1.0 0.2 - 1.0 mg/dL   Nitrite, UA Negative Negative   Microscopic Examination See below:   Bladder Scan (Post Void Residual) in office  Result Value Ref Range   Scan Result 20 ml      Assessment & Plan:    BPH with nocturia  - He's emptying well.  - On Dutasteride and Flomax,  will continue these medications  - His nocturia is like multi-factorial including underlying BPH, sleep disturbance with his wife, and fluid intake. Discussed differential diagnosis.   - Discussed the addition of Gemtesa. Will avoid anticholinergics based on his age, cognitive concerns, and chronic constipation. He prefers to try behavior modification of when he drinks water to see if that helps first before adding another medication.  - Urinalysis today is negative, reassuring that there is no underlying infection or significant pathology  - Reasonable to continue to defer PSA screening given his advanced age.   Return in about 6 months (around 01/24/2023).  I have reviewed the above documentation for accuracy and completeness, and I agree with the above.   Vanna Scotland, MD   [facility] [address],  [address] [place], [place] [address] [phone]

## 2022-07-24 NOTE — Addendum Note (Signed)
Addended by: Vanna Scotland on: 07/24/2022 05:07 PM   Modules accepted: Orders

## 2022-11-08 ENCOUNTER — Other Ambulatory Visit (INDEPENDENT_AMBULATORY_CARE_PROVIDER_SITE_OTHER): Payer: Self-pay | Admitting: Vascular Surgery

## 2022-11-08 DIAGNOSIS — I714 Abdominal aortic aneurysm, without rupture, unspecified: Secondary | ICD-10-CM

## 2022-11-20 ENCOUNTER — Ambulatory Visit (INDEPENDENT_AMBULATORY_CARE_PROVIDER_SITE_OTHER): Payer: Medicare PPO

## 2022-11-20 ENCOUNTER — Ambulatory Visit (INDEPENDENT_AMBULATORY_CARE_PROVIDER_SITE_OTHER): Payer: Medicare PPO | Admitting: Vascular Surgery

## 2022-11-20 ENCOUNTER — Encounter (INDEPENDENT_AMBULATORY_CARE_PROVIDER_SITE_OTHER): Payer: Self-pay | Admitting: Vascular Surgery

## 2022-11-20 VITALS — BP 120/72 | HR 60 | Resp 16 | Wt 161.2 lb

## 2022-11-20 DIAGNOSIS — I723 Aneurysm of iliac artery: Secondary | ICD-10-CM

## 2022-11-20 DIAGNOSIS — I714 Abdominal aortic aneurysm, without rupture, unspecified: Secondary | ICD-10-CM

## 2022-11-20 DIAGNOSIS — E7800 Pure hypercholesterolemia, unspecified: Secondary | ICD-10-CM

## 2022-11-20 DIAGNOSIS — I7143 Infrarenal abdominal aortic aneurysm, without rupture: Secondary | ICD-10-CM

## 2022-11-20 DIAGNOSIS — I10 Essential (primary) hypertension: Secondary | ICD-10-CM

## 2022-11-20 NOTE — Progress Notes (Signed)
MRN : [id number]  Brad Brady is a 89 y.o. (16-Dec-1927) male who presents with chief complaint of  Chief Complaint  Patient presents with   Follow-up    Ultrasound follow up  .  History of Present Illness: Patient returns today in follow up of his abdominal aortic aneurysm and iliac artery aneurysm or ectasia.  He is doing well.  He had no aneurysm related symptoms. Specifically, the patient denies new back or abdominal pain, or signs of peripheral embolization.  No new complaints today.  Duplex today shows maximal diameter of the aorta just below 3 cm with some mild aneurysmal degeneration of both common iliac arteries measuring approximately 1.5 cm on each side.  Current Outpatient Medications  Medication Sig Dispense Refill   acetaminophen (TYLENOL) 500 MG tablet Take 500 mg by mouth every 6 (six) hours as needed for moderate pain.      aspirin EC 81 MG tablet Take 81 mg by mouth every other day.     atenolol (TENORMIN) 25 MG tablet Take 1 tablet (25 mg total) by mouth daily. (Patient taking differently: Take 12.5 mg by mouth daily.) 30 tablet 0   atorvastatin (LIPITOR) 20 MG tablet Take 20 mg by mouth at bedtime.     Cholecalciferol (VITAMIN D3) 2000 UNITS capsule Take 2,000 Units by mouth every morning.     dutasteride (AVODART) 0.5 MG capsule Take 1 capsule (0.5 mg total) by mouth daily. 90 capsule 3   Ferrous Sulfate (IRON) 325 (65 Fe) MG TABS Take 325 mg by mouth every other day.      Multiple Vitamins-Minerals (PRESERVISION/LUTEIN PO) Take 1 tablet by mouth 2 (two) times daily.      polyethylene glycol (MIRALAX / GLYCOLAX) 17 g packet Take 17 g by mouth daily.     SYNTHROID 125 MCG tablet Take 125 mcg by mouth daily before breakfast.      tamsulosin (FLOMAX) 0.4 MG CAPS capsule Take 1 capsule (0.4 mg total) by mouth daily. 90 capsule 3   vitamin B-12 (CYANOCOBALAMIN) 1000 MCG tablet Take 1,000 mcg by mouth daily.      No current facility-administered medications for this  visit.    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    High cholesterol    Hypertension    Hypothyroidism    IBS (irritable bowel syndrome)    Thyroid disease    Vitamin D deficiency     Past Surgical History:  Procedure Laterality Date   CARDIAC CATHETERIZATION     COLONOSCOPY     COLONOSCOPY WITH PROPOFOL N/A 10/20/2014   Procedure: COLONOSCOPY WITH PROPOFOL;  Surgeon: Scot Jun, MD;  [facility] ENDOSCOPY;  Service: Endoscopy;  Laterality: N/A;   ESOPHAGOGASTRODUODENOSCOPY (EGD) WITH PROPOFOL N/A 10/20/2014   Procedure: ESOPHAGOGASTRODUODENOSCOPY (EGD) WITH PROPOFOL;  Surgeon: Scot Jun, MD;  Location: [facility] ENDOSCOPY;  Service: Endoscopy;  Laterality: N/A;   HERNIA REPAIR Right 1996   INGUINAL HERNIA REPAIR Left 10/20/2018   Procedure: OPEN LEFT HERNIA REPAIR INGUINAL ADULT;  Surgeon: Carolan Shiver, MD;  Location: [facility] ORS;  Service: General;  Laterality: Left;   PTCA of PDA       Social History   Tobacco Use   Smoking status: Never   Smokeless tobacco: Never  Substance Use Topics   Alcohol use: No  Drug use: No      Family History  Problem Relation Age of Onset   Multiple myeloma Father      No Known Allergies   REVIEW OF SYSTEMS (Negative unless checked)  Constitutional: [] Weight loss  [] Fever  [] Chills Cardiac: [] Chest pain   [] Chest pressure   [] Palpitations   [] Shortness of breath when laying flat   [] Shortness of breath at rest   [] Shortness of breath with exertion. Vascular:  [] Pain in legs with walking   [] Pain in legs at rest   [] Pain in legs when laying flat   [] Claudication   [] Pain in feet when walking  [] Pain in feet at rest  [] Pain in feet when laying flat   [] History of DVT   [] Phlebitis   [] Swelling in legs   [] Varicose veins   [] Non-healing ulcers Pulmonary:    [] Uses home oxygen   [] Productive cough   [] Hemoptysis   [] Wheeze  [] COPD   [] Asthma Neurologic:  [] Dizziness  [] Blackouts   [] Seizures   [] History of stroke   [] History of TIA  [] Aphasia   [] Temporary blindness   [] Dysphagia   [] Weakness or numbness in arms   [] Weakness or numbness in legs Musculoskeletal:  [] Arthritis   [] Joint swelling   [] Joint pain   [] Low back pain Hematologic:  [] Easy bruising  [] Easy bleeding   [] Hypercoagulable state   [x] Anemic   Gastrointestinal:  [] Blood in stool   [] Vomiting blood  [x] Gastroesophageal reflux/heartburn   [] Abdominal pain Genitourinary:  [] Chronic kidney disease   [] Difficult urination  [] Frequent urination  [] Burning with urination   [] Hematuria Skin:  [] Rashes   [] Ulcers   [] Wounds Psychological:  [] History of anxiety   []  History of major depression.  Physical Examination  BP 120/72 (BP Location: Left Arm)   Pulse 60   Resp 16   Wt 161 lb 3.2 oz (73.1 kg)   BMI 23.13 kg/m  Gen:  WD/WN, NAD. Appears younger than stated age. Head: [place]/AT, No temporalis wasting. Ear/Nose/Throat: Hearing grossly intact, nares w/o erythema or drainage Eyes: Conjunctiva clear. Sclera non-icteric Neck: Supple.  Trachea midline Pulmonary:  Good air movement, no use of accessory muscles.  Cardiac: RRR, no JVD Vascular:  Vessel Right Left  Radial Palpable Palpable                                   Gastrointestinal: soft, non-tender/non-distended. No guarding/reflex.  Musculoskeletal: M/S 5/5 throughout.  No deformity or atrophy. No edema. Neurologic: Sensation grossly intact in extremities.  Symmetrical.  Speech is fluent.  Psychiatric: Judgment intact, Mood & affect appropriate for pt's clinical situation. Dermatologic: No rashes or ulcers noted.  No cellulitis or open wounds.      Labs No results found for this or any previous visit (from the past 2160 hour(s)).  Radiology No results found.  Assessment/Plan  Hypertension blood pressure  control important in reducing the progression of atherosclerotic disease and aneurysmal degeneration. On appropriate oral medications.     High cholesterol lipid control important in reducing the progression of atherosclerotic disease. Continue statin therapy   AAA (abdominal aortic aneurysm) (HCC) Duplex today shows a stable 2.9-3 cm abdominal aortic aneurysm and proximally 1.5 cm left common iliac artery aneurysm.  No growth from his study 2 years ago.  Continue to follow every other year.   Iliac artery aneurysm (HCC) Duplex today shows a stable 2.9-3 cm abdominal aortic aneurysm and proximally 1.5 cm  left common iliac artery aneurysm.  No growth from his study 2 years ago.   Festus Barren, MD  11/20/2022 10:25 AM    This note was created with Dragon medical transcription system.  Any errors from dictation are purely unintentional

## 2022-11-29 ENCOUNTER — Other Ambulatory Visit: Payer: Self-pay | Admitting: Urology

## 2023-04-25 ENCOUNTER — Other Ambulatory Visit: Payer: Self-pay | Admitting: Urology

## 2023-05-14 ENCOUNTER — Encounter: Payer: Self-pay | Admitting: Dermatology

## 2023-05-14 ENCOUNTER — Ambulatory Visit: Payer: Medicare PPO | Admitting: Dermatology

## 2023-05-14 DIAGNOSIS — L578 Other skin changes due to chronic exposure to nonionizing radiation: Secondary | ICD-10-CM

## 2023-05-14 DIAGNOSIS — Z872 Personal history of diseases of the skin and subcutaneous tissue: Secondary | ICD-10-CM

## 2023-05-14 DIAGNOSIS — L820 Inflamed seborrheic keratosis: Secondary | ICD-10-CM

## 2023-05-14 DIAGNOSIS — Z1283 Encounter for screening for malignant neoplasm of skin: Secondary | ICD-10-CM

## 2023-05-14 DIAGNOSIS — L814 Other melanin hyperpigmentation: Secondary | ICD-10-CM

## 2023-05-14 DIAGNOSIS — D692 Other nonthrombocytopenic purpura: Secondary | ICD-10-CM

## 2023-05-14 DIAGNOSIS — W908XXA Exposure to other nonionizing radiation, initial encounter: Secondary | ICD-10-CM | POA: Diagnosis not present

## 2023-05-14 DIAGNOSIS — B351 Tinea unguium: Secondary | ICD-10-CM

## 2023-05-14 DIAGNOSIS — D1801 Hemangioma of skin and subcutaneous tissue: Secondary | ICD-10-CM

## 2023-05-14 DIAGNOSIS — L853 Xerosis cutis: Secondary | ICD-10-CM

## 2023-05-14 DIAGNOSIS — D229 Melanocytic nevi, unspecified: Secondary | ICD-10-CM

## 2023-05-14 DIAGNOSIS — L821 Other seborrheic keratosis: Secondary | ICD-10-CM

## 2023-05-14 NOTE — Patient Instructions (Addendum)
 Gentle Skin Care Guide  1. Bathe no more than once a day.  2. Avoid bathing in hot water  3. Use a mild soap like Dove, Vanicream, Cetaphil, CeraVe. Can use Lever 2000 or Cetaphil antibacterial soap  4. Use soap only where you need it. On most days, use it under your arms, between your legs, and on your feet. Let the water rinse other areas unless visibly dirty.  5. When you get out of the bath/shower, use a towel to gently blot your skin dry, don't rub it.  6. While your skin is still a little damp, apply a moisturizing cream such as Vanicream, CeraVe, Cetaphil, Eucerin, Sarna lotion or plain Vaseline Jelly. For hands apply Neutrogena [place] Hand Cream or Excipial Hand Cream.  7. Reapply moisturizer any time you start to itch or feel dry.  8. Sometimes using free and clear laundry detergents can be helpful. Fabric softener sheets should be avoided. Downy Free & Gentle liquid, or any liquid fabric softener that is free of dyes and perfumes, it acceptable to use  9. If your doctor has given you prescription creams you may apply moisturizers over them      Due to recent changes in healthcare laws, you may see results of your pathology and/or laboratory studies on MyChart before the doctors have had a chance to review them. We understand that in some cases there may be results that are confusing or concerning to you. Please understand that not all results are received at the same time and often the doctors may need to interpret multiple results in order to provide you with the best plan of care or course of treatment. Therefore, we ask that you please give [place] 2 business days to thoroughly review all your results before contacting the office for clarification. Should we see a critical lab result, you will be contacted sooner.   If You Need Anything After Your Visit  If you have any questions or concerns for your doctor, please call our main line at [phone] and press option 4 to reach  your doctor's medical assistant. If no one answers, please leave a voicemail as directed and we will return your call as soon as possible. Messages left after 4 pm will be answered the following business day.   You may also send [place] a message via MyChart. We typically respond to MyChart messages within 1-2 business days.  For prescription refills, please ask your pharmacy to contact our office. Our fax number is [phone].  If you have an urgent issue when the clinic is closed that cannot wait until the next business day, you can page your doctor at the number below.    Please note that while we do our best to be available for urgent issues outside of office hours, we are not available 24/7.   If you have an urgent issue and are unable to reach [place], you may choose to seek medical care at your doctor's office, retail clinic, urgent care center, or emergency room.  If you have a medical emergency, please immediately call 911 or go to the emergency department.  Pager Numbers  - Dr. Gwen Pounds: [phone]  - Dr. Roseanne Reno: [phone]  - Dr. Katrinka Blazing: [phone]   In the event of inclement weather, please call our main line at [phone] for an update on the status of any delays or closures.  Dermatology Medication Tips: Please keep the boxes that topical medications come in in order to help keep track of the instructions  about where and how to use these. Pharmacies typically print the medication instructions only on the boxes and not directly on the medication tubes.   If your medication is too expensive, please contact our office at [phone] option 4 or send [place] a message through MyChart.   We are unable to tell what your co-pay for medications will be in advance as this is different depending on your insurance coverage. However, we may be able to find a substitute medication at lower cost or fill out paperwork to get insurance to cover a needed medication.   If a prior authorization  is required to get your medication covered by your insurance company, please allow [place] 1-2 business days to complete this process.  Drug prices often vary depending on where the prescription is filled and some pharmacies may offer cheaper prices.  The website www.goodrx.com contains coupons for medications through different pharmacies. The prices here do not account for what the cost may be with help from insurance (it may be cheaper with your insurance), but the website can give you the price if you did not use any insurance.  - You can print the associated coupon and take it with your prescription to the pharmacy.  - You may also stop by our office during regular business hours and pick up a GoodRx coupon card.  - If you need your prescription sent electronically to a different pharmacy, notify our office through [facility] or by phone at [phone] option 4.     Si Usted Necesita Algo Despus de Su Visita  Tambin puede enviarnos un mensaje a travs de Clinical cytogeneticist. Por lo general respondemos a los mensajes de MyChart en el transcurso de 1 a 2 das hbiles.  Para renovar recetas, por favor pida a su farmacia que se ponga en contacto con nuestra oficina. Annie Sable de fax es [place] [phone].  Si tiene un asunto urgente cuando la clnica est cerrada y que no puede esperar hasta el siguiente da hbil, puede llamar/localizar a su doctor(a) al nmero que aparece a continuacin.   Por favor, tenga en cuenta que aunque hacemos todo lo posible para estar disponibles para asuntos urgentes fuera del horario de [place], no estamos disponibles las 24 horas del da, los 7 [address]  [address] de la [place].   Si tiene un problema urgente y no puede comunicarse con nosotros, puede optar por buscar atencin mdica  en el consultorio de su doctor(a), en una clnica privada, en un centro de atencin urgente o en una sala de emergencias.  Si tiene Engineer, drilling, por favor llame inmediatamente al 911 o  vaya a la sala de emergencias.  Nmeros de bper  - Dr. Gwen Pounds: [phone]  - Dra. Roseanne Reno: [phone]  - Dr. Katrinka Blazing: [phone]   En caso de inclemencias del tiempo, por favor llame a Lacy Duverney principal al [phone] para una actualizacin sobre el [place] de cualquier retraso o cierre.  Consejos para la medicacin en dermatologa: Por favor, guarde las cajas en las que vienen los medicamentos de uso tpico para ayudarle a seguir las instrucciones sobre dnde y cmo usarlos. Las farmacias generalmente imprimen las instrucciones del medicamento slo en las cajas y no directamente en los tubos del [place].   Si su medicamento es muy caro, por favor, pngase en contacto con Rolm Gala llamando al [phone] y presione la opcin 4 o envenos un mensaje a travs de Clinical cytogeneticist.   No podemos decirle cul ser su copago por los medicamentos por adelantado ya  que esto es diferente dependiendo de la cobertura de su seguro. Sin embargo, es posible que podamos encontrar un medicamento sustituto a Audiological scientist un formulario para que el seguro cubra el medicamento que se considera necesario.   Si se requiere una autorizacin previa para que su compaa de seguros [place] su medicamento, por favor permtanos de 1 a 2 das hbiles para completar [address]  Los precios de los medicamentos varan con frecuencia dependiendo del Environmental consultant de dnde se surte la receta y alguna farmacias pueden ofrecer precios ms baratos.  El sitio web www.goodrx.com tiene cupones para medicamentos de Health and safety inspector. Los precios aqu no tienen en cuenta lo que podra costar con la ayuda del seguro (puede ser ms barato con su seguro), pero el sitio web puede darle el precio si no utiliz Tourist information centre manager.  - Puede imprimir el cupn correspondiente y llevarlo con su receta a la farmacia.  - Tambin puede pasar por nuestra oficina durante el horario de atencin regular y Education officer, museum una tarjeta de cupones  de GoodRx.  - Si necesita que su receta se enve electrnicamente a una farmacia diferente, informe a nuestra oficina a travs de MyChart de [place] o por telfono llamando al [phone] y presione la opcin 4.

## 2023-05-14 NOTE — Progress Notes (Signed)
 Follow-Up Visit   Subjective  Brad Brady is a 89 y.o. male who presents for the following: Skin Cancer Screening and Upper Body Skin Exam  The patient presents for Upper Body Skin Exam (UBSE) for skin cancer screening and mole check. The patient has spots, moles and lesions to be evaluated, some may be new or changing. History of AKs.  Patient has noticed several scaly spots on the face and post auricular, some he scratches.    The following portions of the chart were reviewed this encounter and updated as appropriate: medications, allergies, medical history  Review of Systems:  No other skin or systemic complaints except as noted in HPI or Assessment and Plan.  Objective  Well appearing patient in no apparent distress; mood and affect are within normal limits.  All skin waist up examined. Relevant physical exam findings are noted in the Assessment and Plan.  R postauricular, R forehead, L mid cheek , L mandible Heme-crusted scaly papule at left mid cheek.  Erythematous stuck-on, waxy papule or plaque  Assessment & Plan   INFLAMED SEBORRHEIC KERATOSIS R postauricular, R forehead, L mid cheek , L mandible Not bothersome to patient. No treatment at this time.  Recheck left mid cheek on f/u.  Skin cancer screening performed today.  Actinic Damage - Chronic condition, secondary to cumulative UV/sun exposure - diffuse scaly erythematous macules with underlying dyspigmentation - Recommend daily broad spectrum sunscreen SPF 30+ to sun-exposed areas, reapply every 2 hours as needed.  - Staying in the shade or wearing long sleeves, sun glasses (UVA+UVB protection) and wide brim hats (4-inch brim around the entire circumference of the hat) are also recommended for sun protection.  - Call for new or changing lesions.  Lentigines, Seborrheic Keratoses, Hemangiomas - Benign normal skin lesions - Benign-appearing - Call for any changes  Melanocytic Nevi - Tan-brown and/or  pink-flesh-colored symmetric macules and papules - Benign appearing on exam today - Observation - Call clinic for new or changing moles - Recommend daily use of broad spectrum spf 30+ sunscreen to sun-exposed areas.   Purpura - Chronic; persistent and recurrent.  Treatable, but not curable. - Violaceous macules and patches - Benign - Related to trauma, age, sun damage and/or use of blood thinners, chronic use of topical and/or oral steroids - Observe - Can use OTC arnica containing moisturizer such as Dermend Bruise Formula if desired - Call for worsening or other concerns  Xerosis - diffuse xerotic patches - recommend gentle, hydrating skin care - gentle skin care handout given  HISTORY OF PRECANCEROUS ACTINIC KERATOSIS - site(s) of PreCancerous Actinic Keratosis clear today. - these may recur and new lesions may form requiring treatment to prevent transformation into skin cancer - observe for new or changing spots and contact  [facility] for [facility] if occur - photoprotection with sun protective clothing; sunglasses and broad spectrum sunscreen with SPF of at least 30 + and frequent self skin exams recommended - yearly exams by a dermatologist recommended for persons with history of PreCancerous Actinic Keratoses  ONYCHOMYCOSIS Exam: Thickened fingernails with subungal debris c/w onychomycosis  Chronic and persistent condition with duration or expected duration over one year. Condition is not symptomatic/ bothersome to patient.  Treatment Plan: Not bothersome to patient, defers treatment.   Return in about 6 months (around 11/14/2023) for UBSE, Hx AKs, ISKs.  Wendee Beavers, CMA, am acting as scribe for Willeen Niece, MD .   Documentation: I have reviewed the above documentation for accuracy  and completeness, and I agree with the above.  Willeen Niece, MD

## 2023-07-16 ENCOUNTER — Encounter (INDEPENDENT_AMBULATORY_CARE_PROVIDER_SITE_OTHER): Payer: Self-pay

## 2023-07-23 ENCOUNTER — Ambulatory Visit: Payer: Self-pay | Admitting: Urology

## 2023-07-23 VITALS — BP 109/62 | HR 67 | Ht 70.0 in | Wt 165.0 lb

## 2023-07-23 DIAGNOSIS — R351 Nocturia: Secondary | ICD-10-CM | POA: Diagnosis not present

## 2023-07-23 DIAGNOSIS — N401 Enlarged prostate with lower urinary tract symptoms: Secondary | ICD-10-CM | POA: Diagnosis not present

## 2023-07-23 LAB — URINALYSIS, COMPLETE
Bilirubin, UA: NEGATIVE
Glucose, UA: NEGATIVE
Ketones, UA: NEGATIVE
Leukocytes,UA: NEGATIVE
Nitrite, UA: NEGATIVE
Protein,UA: NEGATIVE
RBC, UA: NEGATIVE
Specific Gravity, UA: 1.01 (ref 1.005–1.030)
Urobilinogen, Ur: 0.2 mg/dL (ref 0.2–1.0)
pH, UA: 6.5 (ref 5.0–7.5)

## 2023-07-23 LAB — BLADDER SCAN AMB NON-IMAGING: Scan Result: 24

## 2023-07-23 LAB — MICROSCOPIC EXAMINATION

## 2023-07-23 MED ORDER — MIRABEGRON ER 25 MG PO TB24: 25.0000 mg | ORAL_TABLET | Freq: Every day | ORAL | 11 refills | Status: DC

## 2023-07-23 NOTE — Patient Instructions (Signed)
 Try taking Tamsulosin  in the evening for 1 to 2 weeks, if no improvement in the symptoms start taking Myrbetriq.

## 2023-07-23 NOTE — Progress Notes (Signed)
 I,Amy L Pierron,acting as a scribe for Dustin Gimenez, MD.,have documented all relevant documentation on the behalf of Dustin Gimenez, MD,as directed by  Dustin Gimenez, MD while in the presence of Dustin Gimenez, MD.  07/23/2023 1:50 PM   Brad Brady February 14, 1928 [id number]  Referring provider: Little Riff, MD [address] [facility] [address] [facility] [place],  [place] [address]  Chief Complaint  Patient presents with   Follow-up    HPI: 89 year-old male with a personal history of benign prostatic hyperplasia (BPH) managed on dutasteride  and Flomax  presents today for annual follow-up.   At last visit he reported increased nocturia, which he attributed to caring for his wife with dementia and evening fluid intake. At that time, he elected conservative management.   He has a supportive companion who assists with daily activities.  Currently, he reports worsening nocturia, with episodes occurring every 45 minutes to an hour at night, which significantly disrupts his sleep. He denies having sleep apnea.   He denies any issues during the day and reports no start-stop urinary symptoms. He takes Flomax  in the morning and Avodart  (dutasteride ) as prescribed. He has never taken the Flomax  at night.  He has a history of neck and lower back injuries, which cause discomfort when lying down.    IPSS     Row Name 07/23/23 1000         International Prostate Symptom Score   How often have you had the sensation of not emptying your bladder? Not at All     How often have you had to urinate less than every two hours? Almost always     How often have you found you stopped and started again several times when you urinated? Not at All     How often have you found it difficult to postpone urination? Less than 1 in 5 times     How often have you had a weak urinary stream? Less than half the time     How often have you had to strain to start urination? Less than half the time     How many times did  you typically get up at night to urinate? 5 Times     Total IPSS Score 15       Quality of Life due to urinary symptoms   If you were to spend the rest of your life with your urinary condition just the way it is now how would you feel about that? Mixed            Score:  1-7 Mild 8-19 Moderate 20-35 Severe Results for orders placed or performed in visit on 07/23/23  Microscopic Examination   Urine  Result Value Ref Range   WBC, UA 0-5 0 - 5 /hpf   RBC, Urine 0-2 0 - 2 /hpf   Epithelial Cells (non renal) 0-10 0 - 10 /hpf   Casts Present (A) None seen /lpf   Cast Type Granular casts (A) N/A   Bacteria, UA Few None seen/Few  Urinalysis, Complete  Result Value Ref Range   Specific Gravity, UA 1.010 1.005 - 1.030   pH, UA 6.5 5.0 - 7.5   Color, UA Yellow Yellow   Appearance Ur Clear Clear   Leukocytes,UA Negative Negative   Protein,UA Negative Negative/Trace   Glucose, UA Negative Negative   Ketones, UA Negative Negative   RBC, UA Negative Negative   Bilirubin, UA Negative Negative   Urobilinogen, Ur 0.2 0.2 - 1.0 mg/dL  Nitrite, UA Negative Negative   Microscopic Examination Comment    Microscopic Examination See below:   BLADDER SCAN AMB NON-IMAGING  Result Value Ref Range   Scan Result 24 ml     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    High cholesterol    Hypertension    Hypothyroidism    IBS (irritable bowel syndrome)    Thyroid disease    Vitamin D  deficiency     Surgical History: Past Surgical History:  Procedure Laterality Date   CARDIAC CATHETERIZATION     COLONOSCOPY     COLONOSCOPY WITH PROPOFOL  N/A 10/20/2014   Procedure: COLONOSCOPY WITH PROPOFOL ;  Surgeon: Cassie Click, MD;  Location: [facility] ENDOSCOPY;  Service: Endoscopy;  Laterality: N/A;    ESOPHAGOGASTRODUODENOSCOPY (EGD) WITH PROPOFOL  N/A 10/20/2014   Procedure: ESOPHAGOGASTRODUODENOSCOPY (EGD) WITH PROPOFOL ;  Surgeon: Cassie Click, MD;  Location: [facility] END[facility]COPY;  Service: Endoscopy;  Laterality: N/A;   HERNIA REPAIR Right 1996   INGUINAL HERNIA REPAIR Left 10/20/2018   Procedure: OPEN LEFT HERNIA REPAIR INGUINAL ADULT;  Surgeon: Eldred Grego, MD;  Location: [facility] ORS;  Service: General;  Laterality: Left;   PTCA of PDA      Home Medications:  Allergies as of 07/23/2023   No Known Allergies      Medication List        Accurate as of Jul 23, 2023  1:50 PM. If you have any questions, ask your nurse or doctor.          acetaminophen  500 MG tablet Commonly known as: TYLENOL  Take 500 mg by mouth every 6 (six) hours as needed for moderate pain.   aspirin  EC 81 MG tablet Take 81 mg by mouth every other day.   atenolol  25 MG tablet Commonly known as: TENORMIN  Take 1 tablet (25 mg total) by mouth daily. What changed: how much to take   atorvastatin  20 MG tablet Commonly known as: LIPITOR Take 20 mg by mouth at bedtime.   cyanocobalamin  1000 MCG tablet Commonly known as: VITAMIN B12 Take 1,000 mcg by mouth daily.   dutasteride  0.5 MG capsule Commonly known as: AVODART  TAKE 1 CAPSULE BY MOUTH EVERY DAY   Iron 325 (65 Fe) MG Tabs Take 325 mg by mouth every other day.   mirabegron ER 25 MG Tb24 tablet Commonly known as: MYRBETRIQ Take 1 tablet (25 mg total) by mouth daily. Started by: Dustin Gimenez   polyethylene glycol 17 g packet Commonly known as: MIRALAX / GLYCOLAX Take 17 g by mouth daily.   PRESERVISION/LUTEIN  PO Take 1 tablet by mouth 2 (two) times daily.   Synthroid  125 MCG tablet Generic drug: levothyroxine  Take 125 mcg by mouth daily before breakfast.   tamsulosin  0.4 MG Caps capsule Commonly known as: FLOMAX  TAKE 1 CAPSULE BY MOUTH EVERY DAY   Vitamin D3 50 MCG (2000 UT) capsule Take 2,000 Units by mouth every morning.         Family History: Family History  Problem Relation Age of Onset   Multiple myeloma Father     Social History:  reports that he has never smoked. He has never used smokeless tobacco. He reports that he does not drink alcohol and does not use drugs.   Physical Exam: BP 109/62   Pulse 67  Ht 5\' 10"  (1.778 m)   Wt 165 lb (74.8 kg)   BMI 23.68 kg/m   Constitutional:  Alert and oriented, No acute distress. HEENT: [place] AT, moist mucus membranes.  Trachea midline, no masses. Neurologic: Grossly intact, no focal deficits, moving all 4 extremities. Psychiatric: Normal mood and affect.  Urinalysis    Component Value Date/Time   APPEARANCEUR Clear 07/23/2023 1039   GLUCOSEU Negative 07/23/2023 1039   BILIRUBINUR Negative 07/23/2023 1039   PROTEINUR Negative 07/23/2023 1039   NITRITE Negative 07/23/2023 1039   LEUKOCYTESUR Negative 07/23/2023 1039    Lab Results  Component Value Date   LABMICR Comment 07/23/2023   WBCUA 0-5 07/23/2023   LABEPIT 0-10 07/23/2023   MUCUS Present (A) 07/24/2022   BACTERIA Few 07/23/2023     Assessment & Plan:    1. Benign Prostatic Hyperplasia (BPH) with Lower Urinary Tract Symptoms (LUTS) - Increased nocturia, now occurring every 45 minutes to an hour at night.  - Recommend to switch Flomax  administration to nighttime to see if it improves symptoms. If no improvement is noted after one to two weeks, Myrbetriq 25 mg at nighttime will be initiated.   - Follow-up with a PA in six weeks with an International Prostate Symptom Score (IPSS) and post-void residual (PVR) measurement is recommended.  2. Nocturia - Denies sleep apnea and reports that nocturia is not associated with significant urine volume. The plan includes the aforementioned medication adjustments and follow-up to assess the effectiveness of the treatment changes.  Return in about 6 weeks (around 09/03/2023) for IPSS, PVR.   I have reviewed the above documentation for accuracy and  completeness, and I agree with the above.   Dustin Gimenez, MD  [facility] [address], [address] [place], [place] [address] [phone]

## 2023-07-29 ENCOUNTER — Other Ambulatory Visit: Payer: Self-pay | Admitting: Urology

## 2023-08-20 ENCOUNTER — Other Ambulatory Visit: Payer: Self-pay

## 2023-08-20 ENCOUNTER — Emergency Department

## 2023-08-20 ENCOUNTER — Emergency Department
Admission: EM | Admit: 2023-08-20 | Discharge: 2023-08-20 | Disposition: A | Discharge status: Home / Self Care | Attending: Emergency Medicine | Admitting: Emergency Medicine

## 2023-08-20 DIAGNOSIS — R0789 Other chest pain: Secondary | ICD-10-CM | POA: Insufficient documentation

## 2023-08-20 DIAGNOSIS — I10 Essential (primary) hypertension: Secondary | ICD-10-CM | POA: Insufficient documentation

## 2023-08-20 DIAGNOSIS — R0602 Shortness of breath: Secondary | ICD-10-CM | POA: Diagnosis not present

## 2023-08-20 DIAGNOSIS — R079 Chest pain, unspecified: Secondary | ICD-10-CM

## 2023-08-20 LAB — CBC
HCT: 34 % — ABNORMAL LOW (ref 39.0–52.0)
Hemoglobin: 11.1 g/dL — ABNORMAL LOW (ref 13.0–17.0)
MCH: 31.8 pg (ref 26.0–34.0)
MCHC: 32.6 g/dL (ref 30.0–36.0)
MCV: 97.4 fL (ref 80.0–100.0)
Platelets: 166 10*3/uL (ref 150–400)
RBC: 3.49 MIL/uL — ABNORMAL LOW (ref 4.22–5.81)
RDW: 14.2 % (ref 11.5–15.5)
WBC: 8.5 10*3/uL (ref 4.0–10.5)
nRBC: 0 % (ref 0.0–0.2)

## 2023-08-20 LAB — TROPONIN I (HIGH SENSITIVITY)
Troponin I (High Sensitivity): 11 ng/L (ref <18)
Troponin I (High Sensitivity): 13 ng/L (ref <18)

## 2023-08-20 LAB — BASIC METABOLIC PANEL WITH GFR
Anion gap: 7 (ref 5–15)
BUN: 16 mg/dL (ref 8–23)
CO2: 25 mmol/L (ref 22–32)
Calcium: 8.5 mg/dL — ABNORMAL LOW (ref 8.9–10.3)
Chloride: 98 mmol/L (ref 98–111)
Creatinine, Ser: 0.83 mg/dL (ref 0.61–1.24)
GFR, Estimated: >60 mL/min (ref >60)
Glucose, Bld: 101 mg/dL — ABNORMAL HIGH (ref 70–99)
Potassium: 4.1 mmol/L (ref 3.5–5.1)
Sodium: 130 mmol/L — ABNORMAL LOW (ref 135–145)

## 2023-08-20 NOTE — ED Notes (Signed)
 Pt has his shoes on, did not want hospital socks at this time.

## 2023-08-20 NOTE — ED Notes (Signed)
 Patient Alert and oriented to baseline. Stable and ambulatory to baseline. Patient verbalized understanding of the discharge instructions.  Patient belongings were taken by the patient.

## 2023-08-20 NOTE — ED Triage Notes (Signed)
 Pt to ED from [facility] for substernal chest pressure 4/10 since atleast 1 week. No SOB, intermittent dizziness. Pt alert, oriented. Skin dry. Pt has caregiver with him.

## 2023-08-20 NOTE — ED Notes (Signed)
 ED Provider at bedside.

## 2023-08-20 NOTE — ED Provider Notes (Signed)
 [facility] Provider Note    Event Date/Time   First MD Initiated Contact with Patient 08/20/23 1108     (approximate)  History   Chief Complaint: Chest Pain  HPI  Brad Brady is a 89 y.o. male with a past medical history of gastric reflux, hypertension, hyperlipidemia, chronic neck and back pain, presents to the emergency department for chest discomfort and shortness of breath.  According to the patient for the past few days he has been experiencing a slight discomfort in his chest that he states is very mild and he is not sure if it is related to his chronic neck or back pain.  He also states he has been experiencing some slight shortness of breath.  Again he states this is mild and he believes it may be related more to constipation.  Patient states chronic constipation.  Physical Exam   Triage Vital Signs: ED Triage Vitals  Encounter Vitals Group     BP 08/20/23 1032 126/70     Girls Systolic BP Percentile --      Girls Diastolic BP Percentile --      Boys Systolic BP Percentile --      Boys Diastolic BP Percentile --      Pulse Rate 08/20/23 1032 62     Resp 08/20/23 1032 20     Temp 08/20/23 1032 (!) 97.5 F (36.4 C)     Temp Source 08/20/23 1032 Oral     SpO2 08/20/23 1032 100 %     Weight 08/20/23 1032 168 lb (76.2 kg)     Height 08/20/23 1032 5' 10 (1.778 m)     Head Circumference --      Peak Flow --      Pain Score 08/20/23 1033 3     Pain Loc --      Pain Education --      Exclude from Growth Chart --     Most recent vital signs: Vitals:   08/20/23 1032  BP: 126/70  Pulse: 62  Resp: 20  Temp: (!) 97.5 F (36.4 C)  SpO2: 100%    General: Awake, no distress.  CV:  Good peripheral perfusion.  Regular rate and rhythm  Resp:  Normal effort.  Equal breath sounds bilaterally.  Abd:  No distention.  Soft, nontender.  No rebound or guarding.  ED Results / Procedures / Treatments   EKG  EKG viewed and interpreted by myself  shows a normal sinus rhythm at 60 bpm with a slightly widened QRS, normal axis, normal intervals with nonspecific ST changes.  RADIOLOGY  I have reviewed and interpreted the chest x-ray images.  No consolidation on my evaluation.  MEDICATIONS ORDERED IN ED: Medications - No data to display   IMPRESSION / MDM / ASSESSMENT AND PLAN / ED COURSE  I reviewed the triage vital signs and the nursing notes.  Patient's presentation is most consistent with acute presentation with potential threat to life or bodily function.  Patient presents to the emergency department for mild chest discomfort and shortness of breath.  Overall the patient appears well, no distress.  Will check labs including a CBC chemistry and troponin.  Will obtain a chest x-ray and EKG and continue to closely monitor.  Will also obtain an abdominal x-ray given the patient's complaint of constipation although he does state this is chronic.  Patient is speaking in full sentences.  No distress.  Patient's workup is reassuring chest x-ray shows no concerning findings,  abdominal x-ray is negative with a normal bowel gas pattern.  CBC reassuring chemistry reassuring troponin negative x 2.  Given the patient's reassuring workup I believe the patient safe for discharge home with PCP follow-up.  FINAL CLINICAL IMPRESSION(S) / ED DIAGNOSES   Chest pain Dyspnea   Note:  This document was prepared using Dragon voice recognition software and may include unintentional dictation errors.   Dorothyann Drivers, MD 08/20/23 1355

## 2023-09-03 ENCOUNTER — Ambulatory Visit: Admitting: Physician Assistant

## 2023-09-03 VITALS — BP 100/67 | HR 72 | Ht 70.0 in | Wt 165.0 lb

## 2023-09-03 DIAGNOSIS — R351 Nocturia: Secondary | ICD-10-CM | POA: Diagnosis not present

## 2023-09-03 DIAGNOSIS — N401 Enlarged prostate with lower urinary tract symptoms: Secondary | ICD-10-CM

## 2023-09-03 LAB — BLADDER SCAN AMB NON-IMAGING: Scan Result: 11 mL

## 2023-09-03 NOTE — Progress Notes (Signed)
 09/03/2023 11:10 AM   Zachary ONEIDA Milder 10/07/1927 [id number]  CC: Chief Complaint  Patient presents with   Follow-up   HPI: Brad Brady is a 89 y.o. male with PMH BPH with nocturia on dutasteride  and Flomax  who presents today for follow-up.   At his last visit with Dr. Penne 6 weeks ago, he was recommended to take the Flomax  at night with a meal and augment with Myrbetriq  25 mg at nighttime if his nocturia did not improve.  Today he reports that he switched his Flomax  dosing to his 3:30 p.m. meal it has made things somewhat better, now with nocturia every 60 to 90 minutes.  He did not start Myrbetriq , because he is hesitant to add on another medication.  He wonders if it would be reasonable to switch his Flomax  to 9:30 PM with a snack.  He tends to go to bed around 10 PM.  IPSS 6/mixed as below, previously 15/mixed.  PVR 11 mL.   IPSS     Row Name 09/03/23 1100         International Prostate Symptom Score   How often have you had the sensation of not emptying your bladder? Not at All     How often have you had to urinate less than every two hours? Not at All     How often have you found you stopped and started again several times when you urinated? Not at All     How often have you found it difficult to postpone urination? Not at All     How often have you had a weak urinary stream? Not at All     How often have you had to strain to start urination? Less than 1 in 5 times     How many times did you typically get up at night to urinate? 5 Times     Total IPSS Score 6       Quality of Life due to urinary symptoms   If you were to spend the rest of your life with your urinary condition just the way it is now how would you feel about that? Mixed         PMH: Past Medical History:  Diagnosis Date   Actinic keratosis    Anemia    BPH (benign prostatic hyperplasia)    Coronary artery disease    GERD (gastroesophageal reflux disease)    Herniated thoracic disc without  myelopathy    High cholesterol    Hypertension    Hypothyroidism    IBS (irritable bowel syndrome)    Thyroid disease    Vitamin D  deficiency     Surgical History: Past Surgical History:  Procedure Laterality Date   CARDIAC CATHETERIZATION     COLONOSCOPY     COLONOSCOPY WITH PROPOFOL  N/A 10/20/2014   Procedure: COLONOSCOPY WITH PROPOFOL ;  Surgeon: Lamar ONEIDA Holmes, MD;  Location: [facility] ENDOSCOPY;  Service: Endoscopy;  Laterality: N/A;   ESOPHAGOGASTRODUODENOSCOPY (EGD) WITH PROPOFOL  N/A 10/20/2014   Procedure: ESOPHAGOGASTRODUODENOSCOPY (EGD) WITH PROPOFOL ;  Surgeon: Lamar ONEIDA Holmes, MD;  Location: [facility] ENDOSCOPY;  Service: Endoscopy;  Laterality: N/A;   HERNIA REPAIR Right 1996   INGUINAL HERNIA REPAIR Left 10/20/2018   Procedure: OPEN LEFT HERNIA REPAIR INGUINAL ADULT;  Surgeon: Rodolph Romano, MD;  Location: [facility] ORS;  Service: General;  Laterality: Left;   PTCA of PDA      Home Medications:  Allergies as of 09/03/2023   No Known Allergies  Medication List        Accurate as of September 03, 2023 11:10 AM. If you have any questions, ask your nurse or doctor.          acetaminophen  500 MG tablet Commonly known as: TYLENOL  Take 500 mg by mouth every 6 (six) hours as needed for moderate pain.   aspirin  EC 81 MG tablet Take 81 mg by mouth every other day.   atenolol  25 MG tablet Commonly known as: TENORMIN  Take 1 tablet (25 mg total) by mouth daily. What changed: how much to take   atorvastatin  20 MG tablet Commonly known as: LIPITOR Take 20 mg by mouth at bedtime.   cyanocobalamin  1000 MCG tablet Commonly known as: VITAMIN B12 Take 1,000 mcg by mouth daily.   dutasteride  0.5 MG capsule Commonly known as: AVODART  TAKE 1 CAPSULE BY MOUTH EVERY DAY   Iron 325 (65 Fe) MG Tabs Take 325 mg by mouth every other day.   mirabegron  ER 25 MG Tb24 tablet Commonly known as: MYRBETRIQ  Take 1 tablet (25 mg total) by mouth daily.   polyethylene glycol 17 g  packet Commonly known as: MIRALAX / GLYCOLAX Take 17 g by mouth daily.   PRESERVISION/LUTEIN  PO Take 1 tablet by mouth 2 (two) times daily.   Synthroid  125 MCG tablet Generic drug: levothyroxine  Take 125 mcg by mouth daily before breakfast.   tamsulosin  0.4 MG Caps capsule Commonly known as: FLOMAX  TAKE 1 CAPSULE BY MOUTH EVERY DAY   Vitamin D3 50 MCG (2000 UT) capsule Take 2,000 Units by mouth every morning.        Allergies:  No Known Allergies  Family History: Family History  Problem Relation Age of Onset   Multiple myeloma Father     Social History:   reports that he has never smoked. He has never used smokeless tobacco. He reports that he does not drink alcohol and does not use drugs.  Physical Exam: BP 100/67   Pulse 72   Ht 5' 10 (1.778 m)   Wt 165 lb (74.8 kg)   BMI 23.68 kg/m   Constitutional:  Alert and oriented, no acute distress, nontoxic appearing HEENT: [place], AT Cardiovascular: No clubbing, cyanosis, or edema Respiratory: Normal respiratory effort, no increased work of breathing Skin: No rashes, bruises or suspicious lesions Neurologic: Grossly intact, no focal deficits, moving all 4 extremities Psychiatric: Normal mood and affect  Laboratory Data: Results for orders placed or performed in visit on 09/03/23  BLADDER SCAN AMB NON-IMAGING   Collection Time: 09/03/23 12:19 PM  Result Value Ref Range   Scan Result 11 ml   Assessment & Plan:   1. Benign prostatic hyperplasia with nocturia (Primary) Nocturia has improved somewhat with afternoon dosing of Flomax .  I agree with switching dosing to later in the evening.  We discussed that if this does not get him to his treatment goal, he may easily augment with Myrbetriq  25 mg.  He is in agreement. - BLADDER SCAN AMB NON-IMAGING  Return in about 6 months (around 03/05/2024) for Symptom recheck with PVR.  Lucie Hones, PA-C  [facility] [address], [address] [place], [place] [address] [phone]

## 2023-10-31 ENCOUNTER — Other Ambulatory Visit: Payer: Self-pay | Admitting: Internal Medicine

## 2023-10-31 DIAGNOSIS — R748 Abnormal levels of other serum enzymes: Secondary | ICD-10-CM

## 2023-11-06 ENCOUNTER — Ambulatory Visit
Admission: RE | Admit: 2023-11-06 | Discharge: 2023-11-06 | Disposition: A | Discharge status: Home / Self Care | Source: Ambulatory Visit | Attending: Internal Medicine | Admitting: Internal Medicine

## 2023-11-06 DIAGNOSIS — R748 Abnormal levels of other serum enzymes: Secondary | ICD-10-CM | POA: Diagnosis present

## 2023-11-11 ENCOUNTER — Other Ambulatory Visit: Payer: Self-pay

## 2023-11-11 DIAGNOSIS — N401 Enlarged prostate with lower urinary tract symptoms: Secondary | ICD-10-CM

## 2023-11-11 MED ORDER — TAMSULOSIN HCL 0.4 MG PO CAPS: 0.4000 mg | ORAL_CAPSULE | Freq: Every day | ORAL | 0 refills | Status: DC

## 2024-02-25 ENCOUNTER — Other Ambulatory Visit: Payer: Self-pay | Admitting: Physician Assistant

## 2024-02-25 DIAGNOSIS — N401 Enlarged prostate with lower urinary tract symptoms: Secondary | ICD-10-CM

## 2024-02-28 ENCOUNTER — Other Ambulatory Visit: Payer: Self-pay

## 2024-02-28 MED ORDER — DUTASTERIDE 0.5 MG PO CAPS: 0.5000 mg | ORAL_CAPSULE | Freq: Every day | ORAL | 0 refills | Status: DC

## 2024-03-03 ENCOUNTER — Encounter: Payer: Self-pay | Admitting: Physician Assistant

## 2024-03-03 ENCOUNTER — Ambulatory Visit: Admitting: Physician Assistant

## 2024-03-03 VITALS — BP 98/63 | HR 76 | Ht 70.0 in | Wt 163.8 lb

## 2024-03-03 DIAGNOSIS — R351 Nocturia: Secondary | ICD-10-CM | POA: Diagnosis not present

## 2024-03-03 DIAGNOSIS — N401 Enlarged prostate with lower urinary tract symptoms: Secondary | ICD-10-CM

## 2024-03-03 LAB — BLADDER SCAN AMB NON-IMAGING

## 2024-03-03 MED ORDER — TAMSULOSIN HCL 0.4 MG PO CAPS: 0.4000 mg | ORAL_CAPSULE | Freq: Every day | ORAL | 3 refills | Status: AC

## 2024-03-03 NOTE — Progress Notes (Signed)
 "  03/03/2024 2:18 PM   Zachary ONEIDA Milder 14-Apr-1927 [id number]  CC: Chief Complaint  Patient presents with   Benign prostatic hyperplasia with nocturia   HPI: PHILIPPE GANG is a 89 y.o. male with PMH BPH with nocturia on dutasteride  and Flomax  who presents today for follow-up.   Today he reports his nocturia has improved somewhat since his last visit.  He takes Flomax  around 4 PM daily.  He has not resumed Myrbetriq .  He has also started tramadol, and takes about 25 mg at bedtime.  PVR 15mL.  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    High cholesterol    Hypertension    Hypothyroidism    IBS (irritable bowel syndrome)    Thyroid disease    Vitamin D  deficiency     Surgical History: Past Surgical History:  Procedure Laterality Date   CARDIAC CATHETERIZATION     COLONOSCOPY     COLONOSCOPY WITH PROPOFOL  N/A 10/20/2014   Procedure: COLONOSCOPY WITH PROPOFOL ;  Surgeon: Lamar ONEIDA Holmes, MD;  Location: [facility] ENDOSCOPY;  Service: Endoscopy;  Laterality: N/A;   ESOPHAGOGASTRODUODENOSCOPY (EGD) WITH PROPOFOL  N/A 10/20/2014   Procedure: ESOPHAGOGASTRODUODENOSCOPY (EGD) WITH PROPOFOL ;  Surgeon: Lamar ONEIDA Holmes, MD;  Location: [facility] ENDOSCOPY;  Service: Endoscopy;  Laterality: N/A;   HERNIA REPAIR Right 1996   INGUINAL HERNIA REPAIR Left 10/20/2018   Procedure: OPEN LEFT HERNIA REPAIR INGUINAL ADULT;  Surgeon: Rodolph Romano, MD;  Location: [facility] ORS;  Service: General;  Laterality: Left;   PTCA of PDA      Home Medications:  Allergies as of 03/03/2024   No Known Allergies      Medication List        Accurate as of March 03, 2024  2:18 PM. If you have any questions, ask your nurse or doctor.          STOP taking these medications    mirabegron  ER 25 MG Tb24 tablet Commonly known  as: MYRBETRIQ  Stopped by: Lucie Hones       TAKE these medications    acetaminophen  500 MG tablet Commonly known as: TYLENOL  Take 500 mg by mouth every 6 (six) hours as needed for moderate pain.   aspirin  EC 81 MG tablet Take 81 mg by mouth every other day.   atenolol  25 MG tablet Commonly known as: TENORMIN  Take 12.5 mg by mouth.   atorvastatin  20 MG tablet Commonly known as: LIPITOR Take 20 mg by mouth daily.   cyanocobalamin  1000 MCG tablet Commonly known as: VITAMIN B12 Take 1,000 mcg by mouth daily.   donepezil 5 MG tablet Commonly known as: ARICEPT Take 5 mg by mouth at bedtime.   dutasteride  0.5 MG capsule Commonly known as: AVODART  Take 1 capsule (0.5 mg total) by mouth daily.   Iron 325 (65 Fe) MG Tabs Take 325 mg by mouth every other day.   levothyroxine  137 MCG tablet Commonly known as: SYNTHROID  Take 137 mcg by mouth.   meloxicam 15 MG tablet Commonly known as: MOBIC TAKE ONE TABLET (15 MG) BY MOUTH ONCE DAILY   polyethylene glycol 17 g packet Commonly known as: MIRALAX / GLYCOLAX Take 17 g by mouth daily.   predniSONE 20 MG tablet Commonly known as: DELTASONE Take 20 mg by mouth 2 (two) times daily.  PRESERVISION/LUTEIN  PO Take 1 tablet by mouth 2 (two) times daily.   sertraline 25 MG tablet Commonly known as: ZOLOFT Take 25 mg by mouth daily.   tamsulosin  0.4 MG Caps capsule Commonly known as: FLOMAX  Take 1 capsule (0.4 mg total) by mouth daily.   traMADol 50 MG tablet Commonly known as: ULTRAM Take 50 mg by mouth.   Vitamin D3 50 MCG (2000 UT) capsule Take 2,000 Units by mouth every morning.        Allergies:  Allergies[1]  Family History: Family History  Problem Relation Age of Onset   Multiple myeloma Father     Social History:   reports that he has never smoked. He has never used smokeless tobacco. He reports that he does not drink alcohol and does not use drugs.  Physical Exam: BP 98/63   Pulse 76    Ht 5' 10 (1.778 m)   Wt 163 lb 12.8 oz (74.3 kg)   BMI 23.50 kg/m   Constitutional:  Alert and oriented, no acute distress, nontoxic appearing HEENT: [place], AT Cardiovascular: No clubbing, cyanosis, or edema Respiratory: Normal respiratory effort, no increased work of breathing Skin: No rashes, bruises or suspicious lesions Neurologic: Grossly intact, no focal deficits, moving all 4 extremities Psychiatric: Normal mood and affect  Laboratory Data: Results for orders placed or performed in visit on 03/03/24  Bladder Scan (Post Void Residual) in office   Collection Time: 03/03/24 11:11 AM  Result Value Ref Range   Scan Result 15ml    Assessment & Plan:   1. Benign prostatic hyperplasia with nocturia (Primary) Improved nocturia, question if p.m. tramadol is helping him sleep with fewer interruptions.  Will continue p.m. Flomax  and see him back next year for annual follow-up. - Bladder Scan (Post Void Residual) in office - tamsulosin  (FLOMAX ) 0.4 MG CAPS capsule; Take 1 capsule (0.4 mg total) by mouth daily.  Dispense: 90 capsule; Refill: 3  Return in about 1 year (around 03/03/2025) for Annual PVR.  Lucie Hones, PA-C  [facility] [place] [address] [place], [place] [address] [phone]     [1] No Known Allergies  "

## 2024-03-24 ENCOUNTER — Other Ambulatory Visit: Payer: Self-pay | Admitting: Physician Assistant

## 2024-05-12 ENCOUNTER — Ambulatory Visit: Admitting: Dermatology

## 2025-03-03 ENCOUNTER — Ambulatory Visit: Admitting: Physician Assistant
